# Patient Record
Sex: Male | Born: 1955
Health system: Southern US, Community
[De-identification: ages and names within clinical notes are randomized; demographics above are authoritative.]

## PROBLEM LIST (undated history)

## (undated) DIAGNOSIS — IMO0002 Reserved for concepts with insufficient information to code with codable children: Secondary | ICD-10-CM

## (undated) DIAGNOSIS — C801 Malignant (primary) neoplasm, unspecified: Secondary | ICD-10-CM

## (undated) DIAGNOSIS — M549 Dorsalgia, unspecified: Secondary | ICD-10-CM

## (undated) DIAGNOSIS — G8929 Other chronic pain: Secondary | ICD-10-CM

## (undated) DIAGNOSIS — N410 Acute prostatitis: Secondary | ICD-10-CM

## (undated) HISTORY — DX: Reserved for concepts with insufficient information to code with codable children: IMO0002

## (undated) HISTORY — PX: INCISE AND DRAIN ABCESS: PRO64

## (undated) HISTORY — DX: Malignant (primary) neoplasm, unspecified: C80.1

## (undated) HISTORY — DX: Acute prostatitis: N41.0

## (undated) HISTORY — PX: SPINE SURGERY: SHX786

## (undated) HISTORY — DX: Dorsalgia, unspecified: M54.9

## (undated) HISTORY — DX: Other chronic pain: G89.29

---

## 1994-12-22 HISTORY — PX: INGUINAL HERNIA REPAIR: SUR1180

## 1996-12-22 HISTORY — PX: APPENDECTOMY: SHX54

## 1998-09-04 ENCOUNTER — Ambulatory Visit (HOSPITAL_COMMUNITY): Admission: RE | Admit: 1998-09-04 | Discharge: 1998-09-04 | Payer: Self-pay | Admitting: Family Medicine

## 1998-09-04 ENCOUNTER — Encounter: Payer: Self-pay | Admitting: Family Medicine

## 1999-05-01 ENCOUNTER — Encounter: Payer: Self-pay | Admitting: Orthopedic Surgery

## 1999-05-01 ENCOUNTER — Inpatient Hospital Stay (HOSPITAL_COMMUNITY): Admission: AD | Admit: 1999-05-01 | Discharge: 1999-05-07 | Payer: Self-pay | Admitting: Orthopedic Surgery

## 2000-11-04 ENCOUNTER — Encounter: Payer: Self-pay | Admitting: Surgery

## 2000-11-04 ENCOUNTER — Ambulatory Visit (HOSPITAL_COMMUNITY): Admission: RE | Admit: 2000-11-04 | Discharge: 2000-11-04 | Payer: Self-pay | Admitting: Surgery

## 2000-11-06 ENCOUNTER — Encounter: Payer: Self-pay | Admitting: Surgery

## 2000-11-06 ENCOUNTER — Ambulatory Visit (HOSPITAL_COMMUNITY): Admission: RE | Admit: 2000-11-06 | Discharge: 2000-11-06 | Payer: Self-pay | Admitting: Surgery

## 2000-11-10 ENCOUNTER — Ambulatory Visit (HOSPITAL_COMMUNITY): Admission: RE | Admit: 2000-11-10 | Discharge: 2000-11-10 | Payer: Self-pay | Admitting: Gastroenterology

## 2000-12-22 HISTORY — PX: ESOPHAGOGASTRIC FUNDOPLICATION: SHX405

## 2000-12-28 ENCOUNTER — Encounter: Payer: Self-pay | Admitting: Surgery

## 2000-12-30 ENCOUNTER — Inpatient Hospital Stay (HOSPITAL_COMMUNITY): Admission: EM | Admit: 2000-12-30 | Discharge: 2000-12-31 | Payer: Self-pay | Admitting: Surgery

## 2002-07-20 ENCOUNTER — Inpatient Hospital Stay (HOSPITAL_COMMUNITY): Admission: EM | Admit: 2002-07-20 | Discharge: 2002-07-23 | Payer: Self-pay | Admitting: Psychiatry

## 2003-05-04 ENCOUNTER — Inpatient Hospital Stay (HOSPITAL_COMMUNITY): Admission: EM | Admit: 2003-05-04 | Discharge: 2003-05-08 | Payer: Self-pay | Admitting: Internal Medicine

## 2003-05-04 ENCOUNTER — Encounter: Payer: Self-pay | Admitting: Internal Medicine

## 2004-12-20 ENCOUNTER — Ambulatory Visit: Payer: Self-pay | Admitting: Internal Medicine

## 2005-01-01 ENCOUNTER — Ambulatory Visit (HOSPITAL_BASED_OUTPATIENT_CLINIC_OR_DEPARTMENT_OTHER): Admission: RE | Admit: 2005-01-01 | Discharge: 2005-01-01 | Payer: Self-pay | Admitting: Family Medicine

## 2005-01-01 ENCOUNTER — Ambulatory Visit: Payer: Self-pay | Admitting: Pulmonary Disease

## 2005-01-21 ENCOUNTER — Ambulatory Visit: Payer: Self-pay | Admitting: Internal Medicine

## 2006-01-19 ENCOUNTER — Ambulatory Visit: Payer: Self-pay | Admitting: Family Medicine

## 2006-05-20 ENCOUNTER — Ambulatory Visit: Payer: Self-pay | Admitting: Family Medicine

## 2006-06-10 ENCOUNTER — Ambulatory Visit: Payer: Self-pay | Admitting: Family Medicine

## 2007-07-22 ENCOUNTER — Ambulatory Visit: Payer: Self-pay | Admitting: Family Medicine

## 2007-07-22 ENCOUNTER — Encounter: Payer: Self-pay | Admitting: Family Medicine

## 2007-07-22 DIAGNOSIS — G4733 Obstructive sleep apnea (adult) (pediatric): Secondary | ICD-10-CM | POA: Insufficient documentation

## 2007-07-27 ENCOUNTER — Ambulatory Visit: Payer: Self-pay

## 2007-07-27 ENCOUNTER — Encounter: Payer: Self-pay | Admitting: Family Medicine

## 2007-08-24 ENCOUNTER — Encounter: Payer: Self-pay | Admitting: Internal Medicine

## 2007-09-29 ENCOUNTER — Telehealth: Payer: Self-pay | Admitting: Family Medicine

## 2007-11-11 ENCOUNTER — Encounter (INDEPENDENT_AMBULATORY_CARE_PROVIDER_SITE_OTHER): Payer: Self-pay | Admitting: Internal Medicine

## 2007-11-11 ENCOUNTER — Ambulatory Visit: Payer: Self-pay | Admitting: Family Medicine

## 2007-12-24 ENCOUNTER — Telehealth: Payer: Self-pay | Admitting: Family Medicine

## 2008-04-28 ENCOUNTER — Ambulatory Visit: Payer: Self-pay | Admitting: Family Medicine

## 2008-05-01 ENCOUNTER — Encounter (INDEPENDENT_AMBULATORY_CARE_PROVIDER_SITE_OTHER): Payer: Self-pay | Admitting: *Deleted

## 2008-07-24 ENCOUNTER — Telehealth (INDEPENDENT_AMBULATORY_CARE_PROVIDER_SITE_OTHER): Payer: Self-pay | Admitting: *Deleted

## 2008-09-20 ENCOUNTER — Emergency Department: Payer: Self-pay | Admitting: Emergency Medicine

## 2009-01-11 ENCOUNTER — Ambulatory Visit: Payer: Self-pay | Admitting: Family Medicine

## 2009-01-11 ENCOUNTER — Encounter (INDEPENDENT_AMBULATORY_CARE_PROVIDER_SITE_OTHER): Payer: Self-pay | Admitting: Internal Medicine

## 2009-01-16 LAB — CONVERTED CEMR LAB
ALT: 17 units/L (ref 0–53)
AST: 17 units/L (ref 0–37)
Albumin: 4.2 g/dL (ref 3.5–5.2)
Alkaline Phosphatase: 57 units/L (ref 39–117)
BUN: 12 mg/dL (ref 6–23)
Basophils Absolute: 0 10*3/uL (ref 0.0–0.1)
Basophils Relative: 0.5 % (ref 0.0–3.0)
Bilirubin, Direct: 0.1 mg/dL (ref 0.0–0.3)
CO2: 30 meq/L (ref 19–32)
Calcium: 9.3 mg/dL (ref 8.4–10.5)
Chloride: 104 meq/L (ref 96–112)
Cholesterol: 151 mg/dL (ref 0–200)
Creatinine, Ser: 0.8 mg/dL (ref 0.4–1.5)
Eosinophils Absolute: 0.1 10*3/uL (ref 0.0–0.7)
Eosinophils Relative: 1 % (ref 0.0–5.0)
GFR calc Af Amer: 132 mL/min
GFR calc non Af Amer: 109 mL/min
Glucose, Bld: 92 mg/dL (ref 70–99)
HCT: 45 % (ref 39.0–52.0)
HDL: 27.3 mg/dL — ABNORMAL LOW (ref >39.0)
Hemoglobin: 15.6 g/dL (ref 13.0–17.0)
LDL Cholesterol: 99 mg/dL (ref 0–99)
Lymphocytes Relative: 31.8 % (ref 12.0–46.0)
MCHC: 34.6 g/dL (ref 30.0–36.0)
MCV: 91 fL (ref 78.0–100.0)
Monocytes Absolute: 0.6 10*3/uL (ref 0.1–1.0)
Monocytes Relative: 10.9 % (ref 3.0–12.0)
Neutro Abs: 3.1 10*3/uL (ref 1.4–7.7)
Neutrophils Relative %: 55.8 % (ref 43.0–77.0)
PSA: 1.52 ng/mL (ref 0.10–4.00)
Platelets: 292 10*3/uL (ref 150–400)
Potassium: 3.5 meq/L (ref 3.5–5.1)
RBC: 4.95 M/uL (ref 4.22–5.81)
RDW: 12.5 % (ref 11.5–14.6)
Sodium: 141 meq/L (ref 135–145)
TSH: 0.98 microintl units/mL (ref 0.35–5.50)
Total Bilirubin: 0.9 mg/dL (ref 0.3–1.2)
Total CHOL/HDL Ratio: 5.5
Total Protein: 6.7 g/dL (ref 6.0–8.3)
Triglycerides: 124 mg/dL (ref 0–149)
VLDL: 25 mg/dL (ref 0–40)
WBC: 5.6 10*3/uL (ref 4.5–10.5)

## 2009-02-02 ENCOUNTER — Telehealth: Payer: Self-pay | Admitting: Family Medicine

## 2009-07-09 ENCOUNTER — Telehealth (INDEPENDENT_AMBULATORY_CARE_PROVIDER_SITE_OTHER): Payer: Self-pay | Admitting: Internal Medicine

## 2009-07-11 ENCOUNTER — Ambulatory Visit: Payer: Self-pay | Admitting: Family Medicine

## 2009-07-11 DIAGNOSIS — F329 Major depressive disorder, single episode, unspecified: Secondary | ICD-10-CM

## 2009-07-23 ENCOUNTER — Ambulatory Visit: Payer: Self-pay | Admitting: Family Medicine

## 2009-08-07 ENCOUNTER — Encounter: Payer: Self-pay | Admitting: Family Medicine

## 2009-09-14 ENCOUNTER — Telehealth: Payer: Self-pay | Admitting: Family Medicine

## 2010-02-19 DIAGNOSIS — N410 Acute prostatitis: Secondary | ICD-10-CM

## 2010-02-19 HISTORY — DX: Acute prostatitis: N41.0

## 2010-02-20 ENCOUNTER — Ambulatory Visit: Payer: Self-pay | Admitting: Family Medicine

## 2010-02-20 DIAGNOSIS — M549 Dorsalgia, unspecified: Secondary | ICD-10-CM | POA: Insufficient documentation

## 2010-02-20 LAB — CONVERTED CEMR LAB
Bacteria, UA: 0
Ketones, urine, test strip: NEGATIVE
Nitrite: NEGATIVE
Urine crystals, microscopic: 0 /hpf
Urobilinogen, UA: 0.2
WBC Urine, dipstick: NEGATIVE
Yeast, UA: 0

## 2010-03-11 ENCOUNTER — Telehealth: Payer: Self-pay | Admitting: Family Medicine

## 2010-03-25 ENCOUNTER — Ambulatory Visit: Payer: Self-pay | Admitting: Family Medicine

## 2010-03-25 LAB — CONVERTED CEMR LAB
Casts: 0 /lpf
Ketones, urine, test strip: NEGATIVE
RBC / HPF: 0
Urobilinogen, UA: 0.2
pH: 6

## 2010-03-26 ENCOUNTER — Encounter: Payer: Self-pay | Admitting: Family Medicine

## 2010-03-27 ENCOUNTER — Telehealth: Payer: Self-pay | Admitting: Family Medicine

## 2010-04-08 ENCOUNTER — Emergency Department: Payer: Self-pay | Admitting: Emergency Medicine

## 2010-05-16 ENCOUNTER — Ambulatory Visit: Payer: Self-pay | Admitting: Unknown Physician Specialty

## 2010-06-04 ENCOUNTER — Encounter: Payer: Self-pay | Admitting: Family Medicine

## 2010-06-04 ENCOUNTER — Ambulatory Visit: Payer: Self-pay | Admitting: Pain Medicine

## 2010-06-13 ENCOUNTER — Ambulatory Visit: Payer: Self-pay | Admitting: Pain Medicine

## 2010-07-01 ENCOUNTER — Ambulatory Visit: Payer: Self-pay | Admitting: Pain Medicine

## 2010-07-04 ENCOUNTER — Encounter: Admission: RE | Admit: 2010-07-04 | Discharge: 2010-07-04 | Payer: Self-pay | Admitting: Unknown Physician Specialty

## 2010-07-29 IMAGING — CR ORBITS FOR FOREIGN BODY - 2 VIEW
1 series · 3 of 3 positions shown · non-contrast
Comparison: none

REASON FOR EXAM: eval for metal in eye prior to mri
COMMENTS:

PROCEDURE:     DXR - DXR ORBITS FOR MRI CLEARANCE  - May 16, 2010 [DATE]
RESULT:     Three views of the orbits are submitted. I do not see evidence
of pertain metallic foreign bodies over the orbits. I see no
contraindication to MRI.

[Series 1: view not recorded · 0.17mm/px · 3 of 3 slices shown]
[im 1/3]
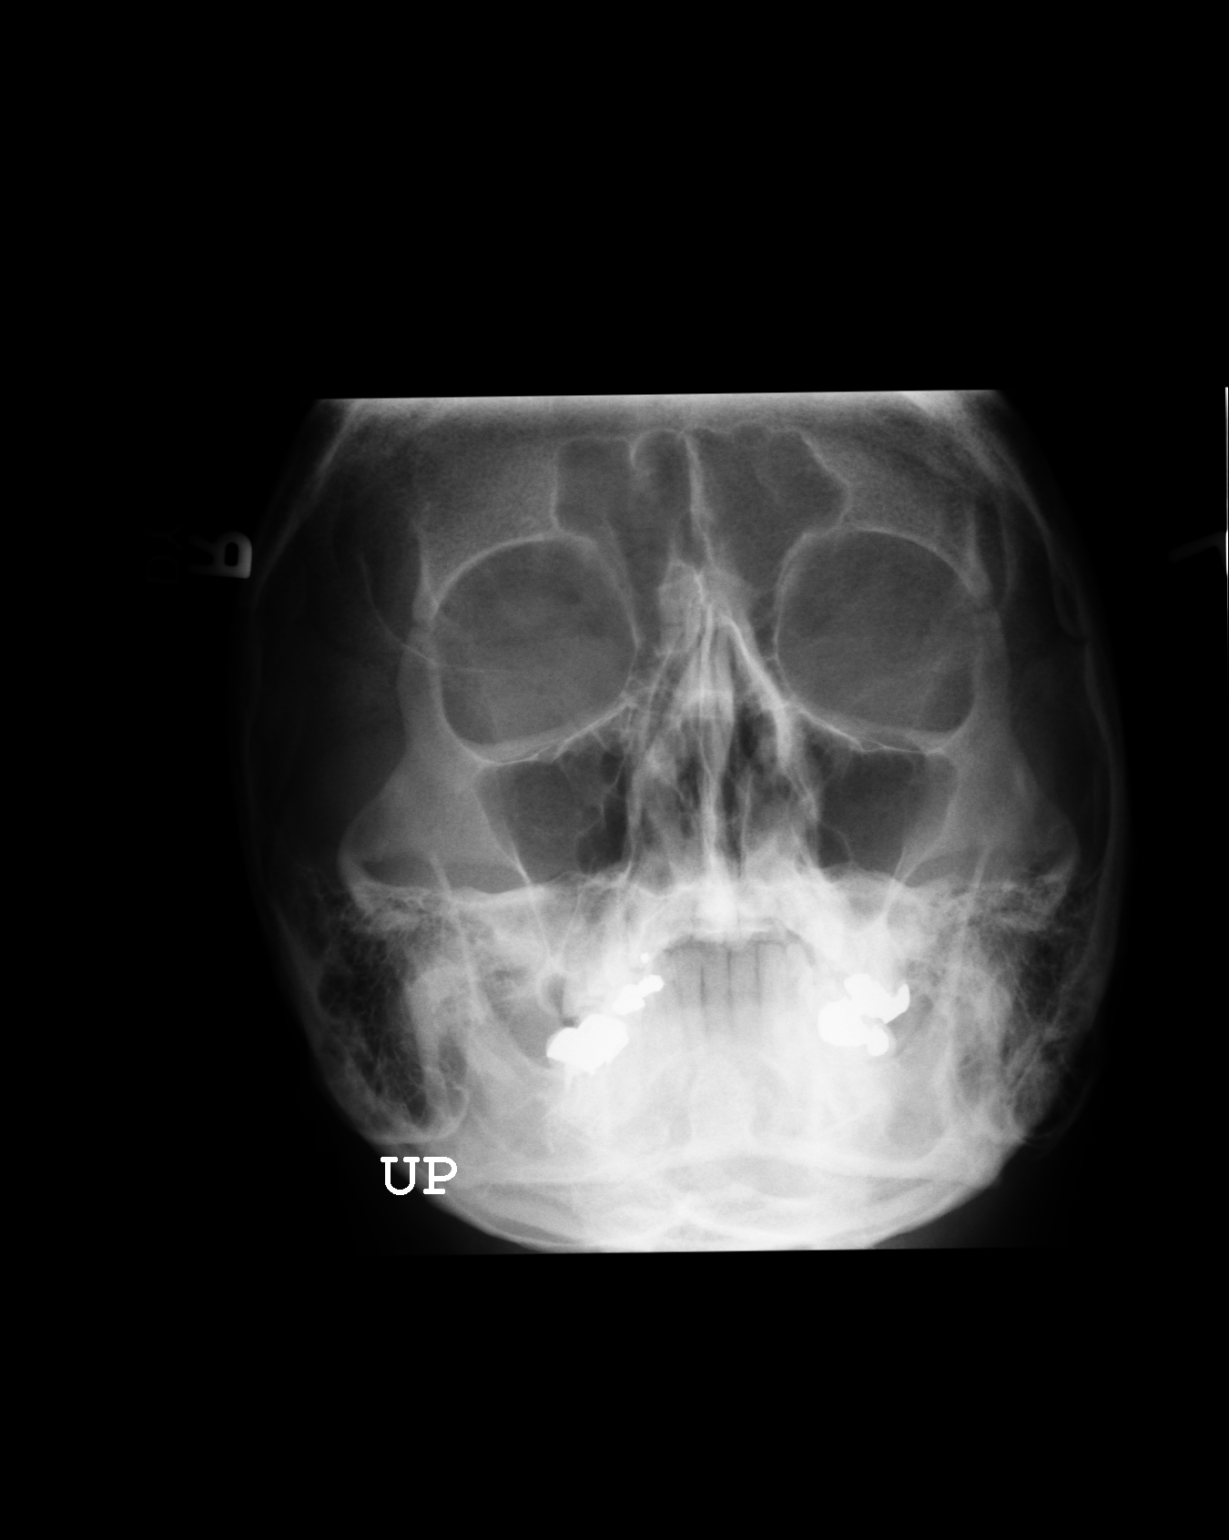
[im 2/3]
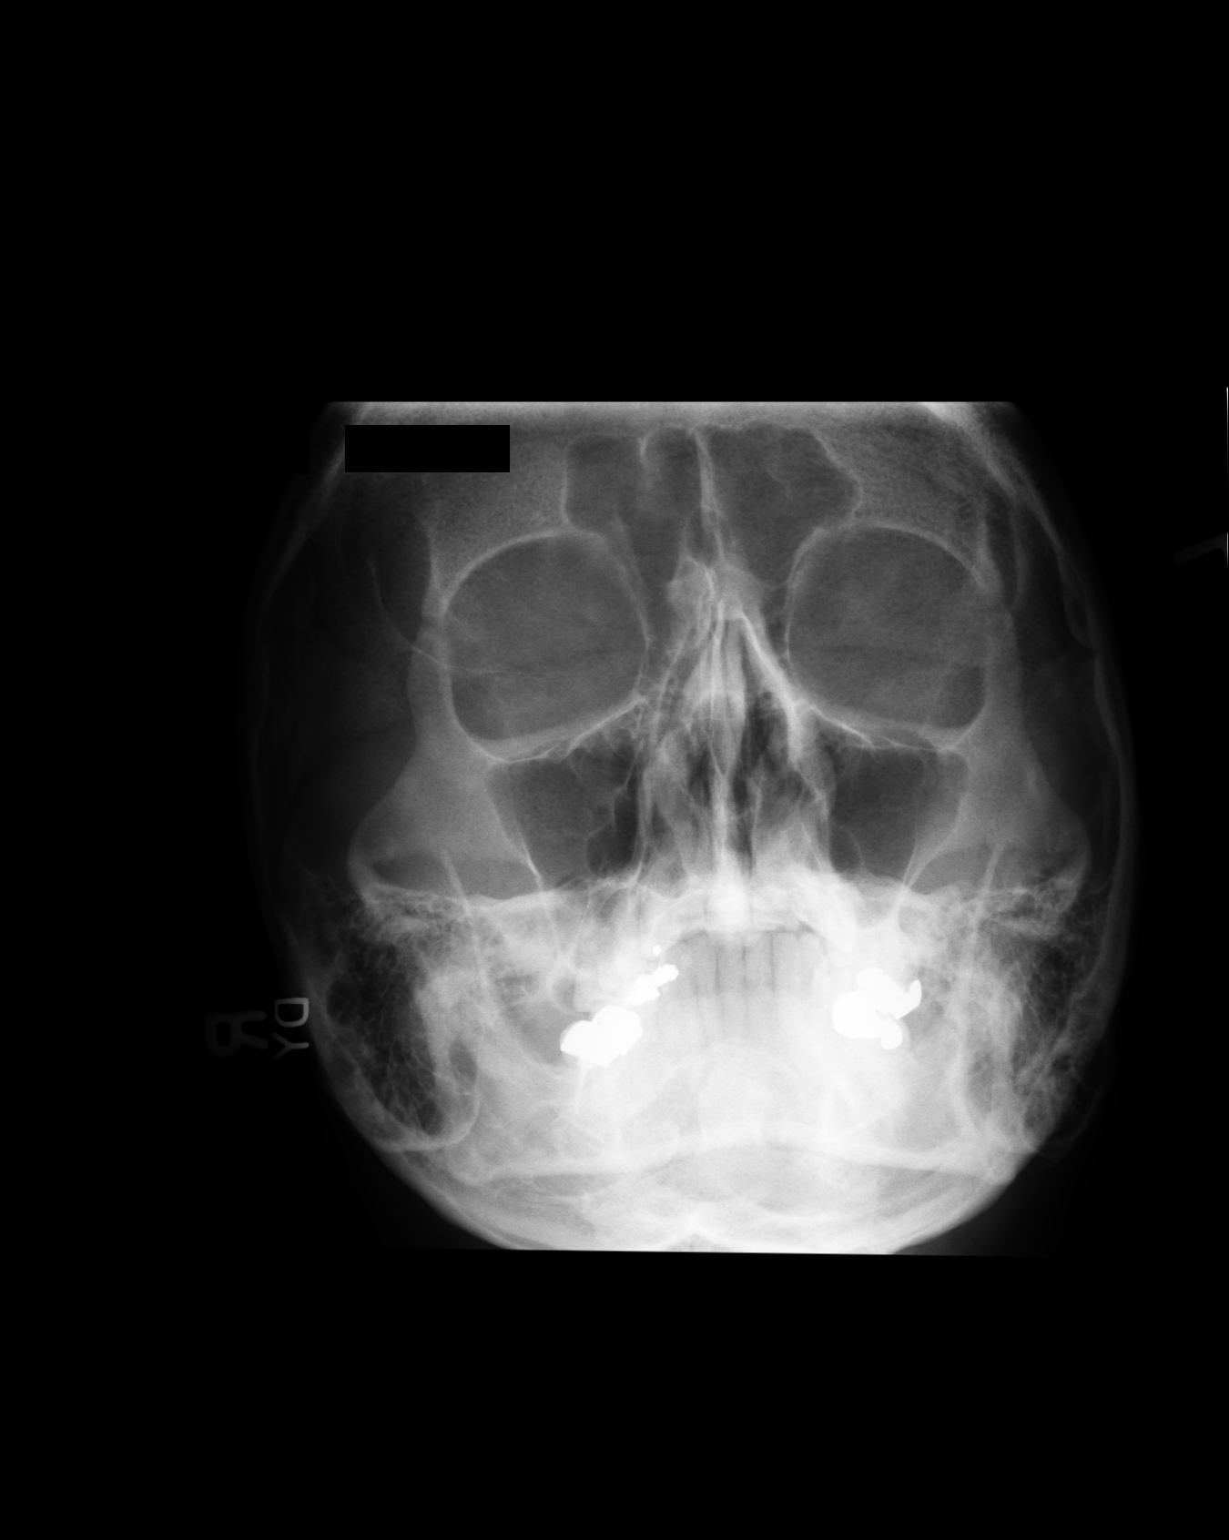
[im 3/3]
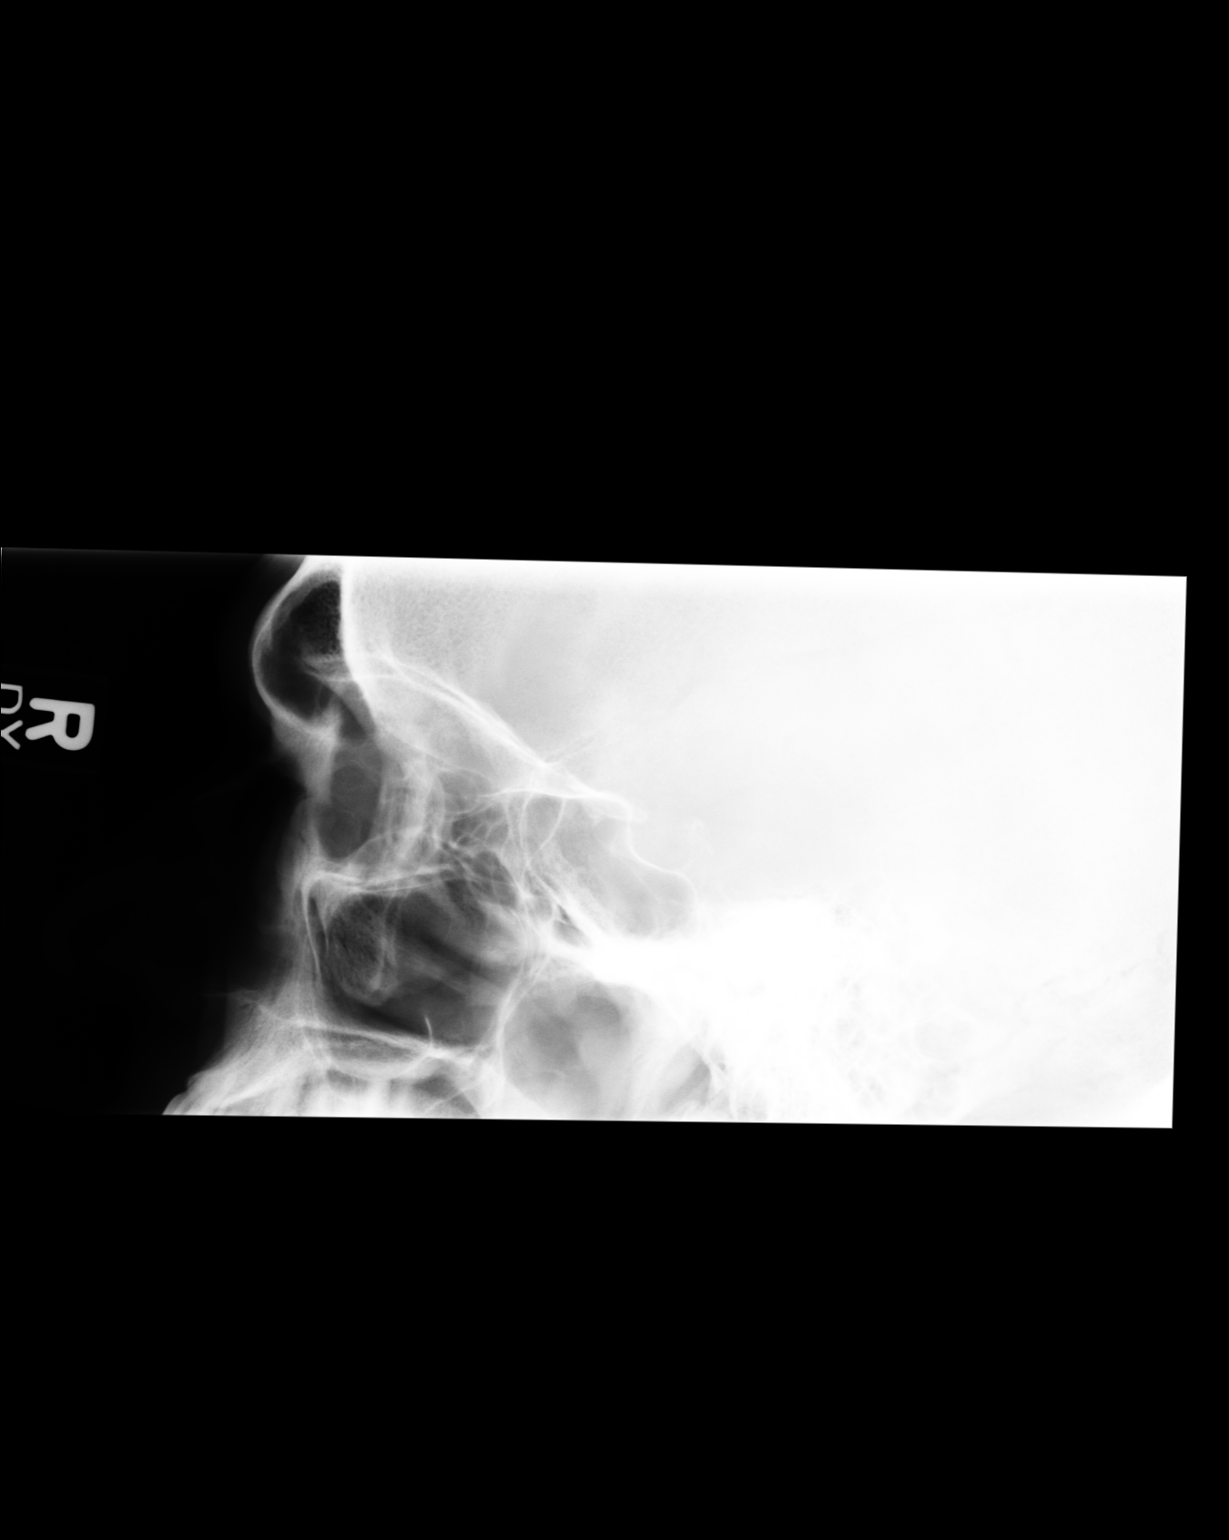

[3 of 3 positions shown; findings below may reference images not displayed]

IMPRESSION: I do not see evidence of retained metallic foreign bodies
over the orbits.

## 2010-07-31 ENCOUNTER — Encounter (INDEPENDENT_AMBULATORY_CARE_PROVIDER_SITE_OTHER): Payer: Self-pay | Admitting: *Deleted

## 2010-10-10 ENCOUNTER — Ambulatory Visit: Payer: Self-pay | Admitting: Pain Medicine

## 2010-10-28 ENCOUNTER — Ambulatory Visit: Payer: Self-pay | Admitting: Pain Medicine

## 2010-10-28 ENCOUNTER — Encounter: Payer: Self-pay | Admitting: Family Medicine

## 2010-12-11 ENCOUNTER — Telehealth: Payer: Self-pay | Admitting: Family Medicine

## 2010-12-11 ENCOUNTER — Ambulatory Visit: Payer: Self-pay | Admitting: Unknown Physician Specialty

## 2010-12-13 ENCOUNTER — Ambulatory Visit: Payer: Self-pay | Admitting: Unknown Physician Specialty

## 2011-01-12 ENCOUNTER — Encounter: Payer: Self-pay | Admitting: Unknown Physician Specialty

## 2011-01-23 NOTE — Letter (Signed)
Summary: [facility]  [facility]   Imported By: Lanelle Bal 06/10/2010 09:44:02  _____________________________________________________________________  External Attachment:    Type:   Image     Comment:   External Document

## 2011-01-23 NOTE — Assessment & Plan Note (Signed)
Summary: 1 M F/U DLO   Vital Signs:  Patient profile:   55 year old male Height:      69 inches Weight:      171.25 pounds BMI:     25.38 Temp:     97.9 degrees F oral Pulse rate:   92 / minute Pulse rhythm:   regular BP sitting:   124 / 80  (left arm) Cuff size:   regular  Vitals Entered By: Lewanda Rife LPN (March 25, 108 9:10 AM) CC: one month f/u on urine   History of Present Illness: here for f/u of prostatitis  was on course of full dose cipro prostate symptoms are better - absolutely no blood in urine or semen now  a little bit of suprapubic pressure --- also some diarrhea from the abx  thinks the pressure and abd rumbling is more rel to the diarrhea than the prostate   finished the cipro ---- 2 weeks ago  still having diarrhea -- ? some pain and crampy , no mucous in stool rectum is very irritated    also intermittent back pain- this is ongoing and chronc uses narcotic pain med occas  - no more than 2-3 times per week - and needs a refil had disc this with Dr Patsy Lager - about reasons not to overuse it     Allergies: 1)  ! Wellbutrin  Past History:  Past Surgical History: Last updated: 01/11/2009 Fundoplication--2002 inguinal hernia x2--mesh on second surg--1996 appendectomy--1998 I&D of L and R knees  Family History: Last updated: 03/25/2010 father - prostate cancer   Social History: Last updated: 01/11/2009 Marital Status: Married--third marriage Children: 4--2 are grown, 2 younger are withhim qoweekend and wife's daughter week Occupation: assembles hyldroloic power units fo rtrains  Risk Factors: Caffeine Use: 2 (01/11/2009) Exercise: no (01/11/2009)  Risk Factors: Smoking Status: never (01/11/2009) Passive Smoke Exposure: yes (01/11/2009)  Past Medical History: Chronic back pain (workman;s comp w/u and pain clinic in the past) acute prostatitis 3/11 family hx prostate cancer- father   Family History: father - prostate cancer   Review  of Systems General:  Denies chills, fatigue, fever, loss of appetite, and malaise. CV:  Denies chest pain or discomfort and palpitations. Resp:  Denies cough and wheezing. GI:  Complains of change in bowel habits and diarrhea; denies bloody stools, dark tarry stools, indigestion, loss of appetite, nausea, and vomiting. GU:  Complains of nocturia; denies dysuria, hematuria, and incontinence. MS:  Complains of low back pain and stiffness; denies cramps and muscle weakness. Derm:  Denies itching, lesion(s), poor wound healing, and rash. Neuro:  Denies numbness and tingling. Psych:  mood is ok . Endo:  Denies cold intolerance, excessive hunger, excessive thirst, excessive urination, and heat intolerance. Heme:  Denies abnormal bruising and bleeding.  Physical Exam  General:  Well-developed,well-nourished,in no acute distress; alert,appropriate and cooperative throughout examination Head:  normocephalic, atraumatic, and no abnormalities observed.   Eyes:  vision grossly intact, pupils equal, pupils round, and pupils reactive to light.  no conjunctival pallor, injection or icterus  Mouth:  pharynx pink and moist.   Neck:  supple with full rom and no masses or thyromegally, no JVD or carotid bruit  Lungs:  Normal respiratory effort, chest expands symmetrically. Lungs are clear to auscultation, no crackles or wheezes. Heart:  Normal rate and regular rhythm. S1 and S2 normal without gallop, murmur, click, rub or other extra sounds. Abdomen:  Bowel sounds positive,abdomen soft and non-tender without masses, organomegaly or  hernias noted. no suprapubic tenderness or fullness felt  Msk:  No deformity or scoliosis noted of thoracic or lumbar spine.  nl rom LS today Extremities:  No clubbing, cyanosis, edema, or deformity noted with normal full range of motion of all joints.   Neurologic:  sensation intact to light touch, gait normal, and DTRs symmetrical and normal.   Skin:  Intact without suspicious  lesions or rashes Cervical Nodes:  No lymphadenopathy noted Inguinal Nodes:  No significant adenopathy Psych:  normal affect, talkative and pleasant    Impression & Recommendations:  Problem # 1:  ACUTE PROSTATITIS (ICD-601.0) Assessment Improved resolved after course of cipro pend urine cx  will update if symptoms return plan for psa with his wellness labs in 3 mo and f/u ( disc fam hx of prostate ca)  Orders: T-Culture, Urine ([id number]) UA Dipstick W/ Micro (manual) (09811)  Problem # 2:  DIARRHEA (ICD-787.91) Assessment: New persistant since cipro need to check for c diff- order given for stool sample for this update if worse  stressed imp of good fluid intake to prev dehydration   Orders: T-Culture, C-Diff Toxin A/B ([id number]) Specimen Handling (99000)  Problem # 3:  BACK PAIN (ICD-724.5) Assessment: Unchanged chronic back pain presumably from deg disk and joint dz  workmans comp w/u and also pain clinic in past and PT  disc use of narcotic only when necessary- not for daily use / disc habit forming and sedating potential refil hydrocodone #30 no ref  today  His updated medication list for this problem includes:    Goodys Body Pain 500-325 Mg Pack (Aspirin-acetaminophen) ..... Otc as directed.    Hydrocodone-acetaminophen 5-500 Mg Tabs (Hydrocodone-acetaminophen) .Marland Kitchen... 1 by mouth q 6 hours as needed severe pain  Complete Medication List: 1)  Amlodipine Besylate 10 Mg Tabs (Amlodipine besylate) .... Take one by mouth daily 2)  Paxil 20 Mg Tabs (Paroxetine hcl) .... Take 1 tablet by mouth once a day 3)  Goodys Body Pain 500-325 Mg Pack (Aspirin-acetaminophen) .... Otc as directed. 4)  Hydrocodone-acetaminophen 5-500 Mg Tabs (Hydrocodone-acetaminophen) .Marland Kitchen.. 1 by mouth q 6 hours as needed severe pain  Patient Instructions: 1)  please do/ return stool test for c diff diarrrhea  2)  we will culture your urine and then update you  3)  keep drinking lots of water    4)  schedule fasting labs in 3 months wellness/ lipid v70.0 and psa for prostate screen  5)  then PE please - any 30 minute slot  6)  if your prostate symptoms  re - occur - please let me know  Prescriptions: HYDROCODONE-ACETAMINOPHEN 5-500 MG TABS (HYDROCODONE-ACETAMINOPHEN) 1 by mouth q 6 hours as needed severe pain  #30 x 0   Entered and Authorized by:   Judith Part MD   Signed by:   Judith Part MD on 03/25/2010   Method used:   Print then Give to Patient   RxID:   [id number]   Current Allergies (reviewed today): ! [facility]  Laboratory Results   Urine Tests    Routine Urinalysis   Color: yellow Appearance: Clear Glucose: negative   (Normal Range: Negative) Bilirubin: negative   (Normal Range: Negative) Ketone: negative   (Normal Range: Negative) Spec. Gravity: 1.010   (Normal Range: 1.003-1.035) Blood: trace-intact   (Normal Range: Negative) pH: 6.0   (Normal Range: 5.0-8.0) Protein: trace   (Normal Range: Negative) Urobilinogen: 0.2   (Normal Range: 0-1) Nitrite: negative   (Normal Range: Negative) Leukocyte Esterace:  negative   (Normal Range: Negative)  Urine Microscopic WBC/hpf: 0-1 RBC/hpf: 0 Bacteria: 0 Mucous: few- mod Epithelial: 1-4 Crystals/LPF: few Casts/LPF: 0 Yeast/HPF: 0 Other: 0

## 2011-01-23 NOTE — Progress Notes (Signed)
Summary: Wife says patient is very sick  Phone Note Call from Patient Call back at Home Phone [phone]   Caller: Spouse Call For: Judith Part MD Summary of Call: FYI:   I called to tell the patient that his urine culture was negative, as advised.  Wife answered saying that the patient was in bed, very sick with an intestinal infection.  She says he is very sick, he has been passing out, and she is concerned that something is very wrong with him.  I offered to make an appointment for him to come in.  She states she will have to get him to do that so that he can correlate it with his work because he has had to miss a lot of work and they are concerned that he will be fired. Initial call taken by: Delilah Shan CMA Duncan Dull),  March 27, 2010 2:35 PM  Follow-up for Phone Call        if he is so sick that he has been passing out - this is very worrisome for dehydration-- probably needs IV fluids  I want her to take him to ER for eval now (call ambulance if he is too weak) he had c/o diarrhea when he was here - but mild at the time- sounds like this has become much worse  his c diff and urine cx are neg - which is re- assuring - but he needs addn eval and likely IV fluids  thank you for updating me about this change  Follow-up by: Judith Part MD,  March 27, 2010 3:12 PM  Additional Follow-up for Phone Call Additional follow up Details #1::        Advised pt's wife, she agreed to get pt to the ER. Lowella Petties CMA  March 27, 2010 3:20 PM  if you know what hospital he is going to -- please fax last 2 office notes and also c diff and urine cx results to that ER- thanks  Additional Follow-up by: Judith Part MD,  March 27, 2010 3:35 PM    Additional Follow-up for Phone Call Additional follow up Details #2::    I called pt's house, no answer.             Lowella Petties CMA  March 27, 2010 3:43 PM  I spoke with pt he had just woke up and plans to go to [facility] [phone]. I called spoke with Mare Loan and she gave fax # [phone]. Spoke with nurse in ER at Cecilia's request to let know pt was coming and I was faxing last 2 office visits and lab. Faxed completed.Lewanda Rife LPN  March 27, 6294 4:27 PM

## 2011-01-23 NOTE — Assessment & Plan Note (Signed)
Summary: blood in urine/ alc   Vital Signs:  Patient profile:   55 year old male Height:      69 inches Weight:      174.4 pounds BMI:     25.85 Temp:     97.7 degrees F oral Pulse rate:   80 / minute Pulse rhythm:   regular BP sitting:   130 / 80  (left arm) Cuff size:   regular  Vitals Entered By: Benny Lennert CMA Duncan Dull) (February 20, 2010 10:28 AM)   History of Present Illness: Chief complaint blood in urine  Some discomfort with urinating. No penile discharge, no pain with ejaculation. Gross blood on urination, but this has only come after ejaculation. Has happened x 2 for the patient.  No STD exposure. no history of prostatitis   also c/o back pain, went to worker's comp MD, had eval, ultimately went to pain clinic, got better some with PT for about a year. NSAIDS don't work except Goody's and muscle relaxants have made him quite drowsy.  Allergies: 1)  ! Wellbutrin  Past History:  Past medical, surgical, family and social histories (including risk factors) reviewed, and no changes noted (except as noted below).  Past Medical History: Chronic back pain  Past Surgical History: Reviewed history from 01/11/2009 and no changes required. Fundoplication--2002 inguinal hernia x2--mesh on second surg--1996 appendectomy--1998 I&D of L and R knees  Family History: Reviewed history and no changes required.  Social History: Reviewed history from 01/11/2009 and no changes required. Marital Status: Married--third marriage Children: 4--2 are grown, 2 younger are withhim qoweekend and wife's daughter week Occupation: assembles hyldroloic power units fo rtrains  Review of Systems      See HPI General:  Denies chills, fatigue, and fever. GU:  Complains of dysuria and hematuria; denies discharge, genital sores, incontinence, nocturia, urinary frequency, and urinary hesitancy.  Physical Exam  General:  Well-developed,well-nourished,in no acute distress;  alert,appropriate and cooperative throughout examination Head:  Normocephalic and atraumatic without obvious abnormalities. No apparent alopecia or balding. Ears:  no external deformities.   Nose:  no external deformity.   Lungs:  normal respiratory effort.   Rectal:  No external abnormalities noted. Normal sphincter tone. No rectal masses or tenderness. Genitalia:  Testes bilaterally descended without nodularity, tenderness or masses. No scrotal masses or lesions. No penis lesions or urethral discharge. Prostate:  mild enlargement, but there is diffuse tenderness on palpation, not hot. Extremities:  No clubbing, cyanosis, edema, or deformity noted with normal full range of motion of all joints.   Psych:  Cognition and judgment appear intact. Alert and cooperative with normal attention span and concentration. No apparent delusions, illusions, hallucinations   Impression & Recommendations:  Problem # 1:  ACUTE PROSTATITIS (ICD-601.0) Assessment New For acute prostatitis, ABX  With gross and microscopic hematuria, would recheck urine micro in 1 month to document resolution of hematuria and if remains hematuria, work-up indicated.  Problem # 2:  GROSS HEMATURIA (ICD-599.71) Assessment: New  His updated medication list for this problem includes:    Ciprofloxacin Hcl 500 Mg Tabs (Ciprofloxacin hcl) .Marland Kitchen... 1 by mouth two times a day for 14 days  Orders: UA Dipstick W/ Micro (manual) (16109)  Problem # 3:  BACK PAIN (ICD-724.5) chronic back pain discussed with patient and reviewed records - i think it is reasonable for him to have a small number of vicodin to have on hand for bad days, but discouraged chronic use.   I am going to  cc Dr. Milinda Antis, and I think she needs to be MD following. I would not place him on chronic narcotics, but as needed use is reasonable.  His updated medication list for this problem includes:    Goodys Body Pain 500-325 Mg Pack (Aspirin-acetaminophen) ..... Otc as  directed.    Hydrocodone-acetaminophen 5-500 Mg Tabs (Hydrocodone-acetaminophen) .Marland Kitchen... 1 by mouth q 6 hours as needed severe pain  Complete Medication List: 1)  Amlodipine Besylate 10 Mg Tabs (Amlodipine besylate) .... Take one by mouth daily 2)  Paxil 20 Mg Tabs (Paroxetine hcl) .... Take 1 tablet by mouth once a day 3)  Goodys Body Pain 500-325 Mg Pack (Aspirin-acetaminophen) .... Otc as directed. 4)  Ciprofloxacin Hcl 500 Mg Tabs (Ciprofloxacin hcl) .Marland Kitchen.. 1 by mouth two times a day for 14 days 5)  Hydrocodone-acetaminophen 5-500 Mg Tabs (Hydrocodone-acetaminophen) .Marland Kitchen.. 1 by mouth q 6 hours as needed severe pain Prescriptions: HYDROCODONE-ACETAMINOPHEN 5-500 MG TABS (HYDROCODONE-ACETAMINOPHEN) 1 by mouth q 6 hours as needed severe pain  #30 x 0   Entered and Authorized by:   Hannah Beat MD   Signed by:   Hannah Beat MD on 02/20/2010   Method used:   [facility] ...       [facility] [address] [phone][facility] (retail)       [address] [place], [place]  [address]       Ph: [phone] or [phone]       Fax: [phone]   RxID:   [id number] CIPROFLOXACIN HCL 500 MG TABS (CIPROFLOXACIN HCL) 1 by mouth two times a day for 14 days  #28 x 0   Entered and Authorized by:   Hannah Beat MD   Signed by:   Hannah Beat MD on 02/20/2010   Method used:   [facility]ronically to        [facility] [phone]* (retail)       [address] [place], [place]  [address]       Ph: [phone] or [phone]       Fax: [phone]   RxID:   [id number]   Current Allergies (reviewed today): ! [facility]  Laboratory Results   Urine Tests  Date/Time Received: February 20, 2010 10:33 AM  Date/Time Reported: February 20, 2010 10:33 AM   Routine Urinalysis   Color: yellow Appearance: Clear Glucose: negative   (Normal Range: Negative) Bilirubin: negative   (Normal Range: Negative) Ketone: negative   (Normal Range: Negative) Spec. Gravity: 1.015   (Normal Range: 1.003-1.035) Blood: large    (Normal Range: Negative) pH: 6.0   (Normal Range: 5.0-8.0) Protein: negative   (Normal Range: Negative) Urobilinogen: 0.2   (Normal Range: 0-1) Nitrite: negative   (Normal Range: Negative) Leukocyte Esterace: negative   (Normal Range: Negative)  Urine Microscopic WBC/HPF: 0-1 RBC/HPF: Many Bacteria/HPF: 0 Epithelial/HPF: 1-2 Crystals/HPF: 0 Yeast/HPF: 0

## 2011-01-23 NOTE — Letter (Signed)
Summary: Colonoscopy Letter  [facility]  [address] [place], [place] [address]   Phone: [phone]  Fax: [phone]      July 31, 2010 MRN: [id number]   Jerome Hernandez [address] [place], [place]  [address]   Dear Mr. KAUFMAN,   According to your medical record, it is time for you to schedule a Colonoscopy. The American Cancer Society recommends this procedure as a method to detect early colon cancer. Patients with a family history of colon cancer, or a personal history of colon polyps or inflammatory bowel disease are at increased risk.  This letter has beeen generated based on the recommendations made at the time of your procedure. If you feel that in your particular situation this may no longer apply, please contact our office.  Please call our office at [phone] to schedule this appointment or to update your records at your earliest convenience.  Thank you for cooperating with [place] to provide you with the very best care possible.   Sincerely,   Vania Rea. Jarold Motto, M.D.  [facility] [phone]

## 2011-01-23 NOTE — Progress Notes (Signed)
Summary: potassium is low  Phone Note From Other Clinic   Caller: [facility] pre admit testing  [phone] Summary of Call: Pt is scheduled for back surgery on 12/23 and his potassium today is 3.2.  This needs to be at least 3.4.  Pt will need a quick supplement but I have been trying to get in touch with him all afternoon.  He is not at work and his cell phone is not taking calls.  I notified Clydie Braun in pre admitting about this,  she said she would try to get in touch with his surgeon.                Lowella Petties CMA, AAMA  December 11, 2010 3:25 PM   Follow-up for Phone Call        I'm not aware of any meds that would lower his K ... but have not seen him in a while so probably do not have an up to date med list   if you can reach him -- I will put px on emr for call in  they will have to re check his K the day of surgery  px written on EMR for call in  Follow-up by: Judith Part MD,  December 11, 2010 4:34 PM  Additional Follow-up for Phone Call Additional follow up Details #1::        Unable to reach pt. Pt is not at work, cell # not accepting calls. I spoke with Dondra Spry at pre admission at [facility] and they have not been able to reach pt either. I called Guerry Minors and asked if h e had a contact # and he does not. Lewanda Rife LPN  December 11, 2010 4:51 PM   Still not able to reach pt.Lewanda Rife LPN  December 11, 2010 5:09 PM     Additional Follow-up for Phone Call Additional follow up Details #2::    Patient notified as instructed by telephone.  [facility] preadmitting notified as instructed as well spoke with Sherrie. Med sent [facility] to [facility] as instructed.Lewanda Rife LPN  December 12, 2010 11:28 AM   New/Updated Medications: KLOR-CON M20 20 MEQ CR-TABS (POTASSIUM CHLORIDE CRYS CR) 1 by mouth two times a day today and tomorrow Prescriptions: KLOR-CON M20 20 MEQ CR-TABS (POTASSIUM CHLORIDE CRYS CR) 1 by mouth two times a day today and tomorrow  #4 x 0   Entered by:   Lewanda Rife  LPN   Authorized by:   Judith Part MD   Signed by:   Lewanda Rife LPN on 29/56/2130   Method used:   Electronically to        [facility] [address] (retail)       [address] [place], [place]  [address]       Ph: [phone] or [phone]       Fax: [phone]   RxID:   [id number] KLOR-CON M20 20 MEQ CR-TABS (POTASSIUM CHLORIDE CRYS CR) 1 by mouth two times a day today and tomorrow  #4 x 0   Entered and Authorized by:   Judith Part MD   Signed by:   Lewanda Rife LPN on 95/18/8416   Method used:   Telephoned to ...       [facility] [address] [phone]* (retail)       [address] [place], [place]  [address]  Ph: [phone] or [phone]       Fax: [phone]   RxID:   [id number]

## 2011-01-23 NOTE — Letter (Signed)
Summary: [facility]  [facility]   Imported By: Lanelle Bal 11/06/2010 14:25:13  _____________________________________________________________________  External Attachment:    Type:   Image     Comment:   External Document

## 2011-01-23 NOTE — Progress Notes (Signed)
Summary: still having blood in urine  Phone Note Call from Patient Call back at Home Phone [phone]   Caller: Patient Call For: Judith Part MD Summary of Call: Patient says that he is still having blood in urine. He says that before it was mostly after ejaculation, but now happens at random times. He described it as looking like red koolaid. He also says that at times it looks milky. Has had fever,chills,nausea. He says that he has 2 to 3 days of antibiotic left. I asked him if he took it as instructed and he informed me that in the beginning he was taking 1 every 12 hours, but then started taking one every 24 hours. Patient wants to know what he needs to do. He says that he lives in [place] and it is hard for him to come in for appt. [facility]  Initial call taken by: Melody Comas,  March 11, 2010 2:23 PM  Follow-up for Phone Call        ABX not taken properly Should be taking two times a day   Extend to 1 month course Follow-up by: Hannah Beat MD,  March 11, 2010 2:47 PM  Additional Follow-up for Phone Call Additional follow up Details #1::        Patient advised.Consuello Masse CMA  Additional Follow-up by: Benny Lennert CMA Duncan Dull),  March 11, 2010 2:52 PM    Prescriptions: CIPROFLOXACIN HCL 500 MG TABS (CIPROFLOXACIN HCL) 1 by mouth two times a day for 14 days  #60 x 0   Entered and Authorized by:   Hannah Beat MD   Signed by:   Hannah Beat MD on 03/11/2010   Method used:   Telephoned to ...       [facility] [address]* (retail)       [address] [place], [place]  [address]       Ph: [phone] or [phone]       Fax: [phone]   RxID:   [id number]

## 2011-03-03 ENCOUNTER — Ambulatory Visit (INDEPENDENT_AMBULATORY_CARE_PROVIDER_SITE_OTHER): Payer: 59 | Admitting: Family Medicine

## 2011-03-03 ENCOUNTER — Other Ambulatory Visit: Payer: Self-pay | Admitting: Family Medicine

## 2011-03-03 ENCOUNTER — Encounter: Payer: Self-pay | Admitting: Family Medicine

## 2011-03-03 DIAGNOSIS — Z125 Encounter for screening for malignant neoplasm of prostate: Secondary | ICD-10-CM

## 2011-03-03 DIAGNOSIS — I10 Essential (primary) hypertension: Secondary | ICD-10-CM

## 2011-03-03 DIAGNOSIS — N401 Enlarged prostate with lower urinary tract symptoms: Secondary | ICD-10-CM

## 2011-03-03 DIAGNOSIS — F329 Major depressive disorder, single episode, unspecified: Secondary | ICD-10-CM

## 2011-03-04 LAB — HEPATIC FUNCTION PANEL
Bilirubin, Direct: 0.1 mg/dL (ref 0.0–0.3)
Total Bilirubin: 0.6 mg/dL (ref 0.3–1.2)

## 2011-03-04 LAB — CBC WITH DIFFERENTIAL/PLATELET
Basophils Absolute: 0 10*3/uL (ref 0.0–0.1)
Eosinophils Absolute: 0.2 10*3/uL (ref 0.0–0.7)
MCHC: 34.3 g/dL (ref 30.0–36.0)
MCV: 90.9 fl (ref 78.0–100.0)
Monocytes Absolute: 0.8 10*3/uL (ref 0.1–1.0)
Neutrophils Relative %: 50.6 % (ref 43.0–77.0)
Platelets: 300 10*3/uL (ref 150.0–400.0)
WBC: 7.9 10*3/uL (ref 4.5–10.5)

## 2011-03-04 LAB — LIPID PANEL
Cholesterol: 148 mg/dL (ref 0–200)
LDL Cholesterol: 84 mg/dL (ref 0–99)
Triglycerides: 142 mg/dL (ref 0.0–149.0)

## 2011-03-04 LAB — RENAL FUNCTION PANEL
Albumin: 4.5 g/dL (ref 3.5–5.2)
BUN: 16 mg/dL (ref 6–23)
CO2: 30 mEq/L (ref 19–32)
Calcium: 9.2 mg/dL (ref 8.4–10.5)
Chloride: 98 mEq/L (ref 96–112)

## 2011-03-04 LAB — PSA: PSA: 0.77 ng/mL (ref 0.10–4.00)

## 2011-03-11 NOTE — Assessment & Plan Note (Signed)
Summary: med refills   Vital Signs:  Patient profile:   55 year old male Height:      69 inches Weight:      168.50 pounds BMI:     24.97 Temp:     98.6 degrees F oral Pulse rate:   84 / minute Pulse rhythm:   regular BP sitting:   128 / 88  (left arm) Cuff size:   regular  Vitals Entered By: Lewanda Rife LPN (March 03, 2011 4:12 PM) CC: med refills   History of Present Illness: has been doing ok - overall   had back surgery -- trimmed off some disk and took off spurs   is doing well with paxil overall  has tried to stop it cold and wean off of it  when he stops it -gets depression quickly , with some ringing of ears and dizziness plan is to stay on it   wife has been out of work for 2 years  he is back to work   on amlodipine 10 mg  eating healthy diet  128/88 is doing PT - last week of that after his surgery - rom is definitely better    some difficulty with urination  no pain - just flow is slow -- dribbles afterwards  father had prostate cancer no pain or signs of infection     Allergies: 1)  ! Wellbutrin  Past History:  Family History: Last updated: 03/25/2010 father - prostate cancer   Social History: Last updated: 01/11/2009 Marital Status: Married--third marriage Children: 4--2 are grown, 2 younger are withhim qoweekend and wife's daughter week Occupation: assembles hyldroloic power units fo rtrains  Risk Factors: Caffeine Use: 2 (01/11/2009) Exercise: no (01/11/2009)  Risk Factors: Smoking Status: never (01/11/2009) Passive Smoke Exposure: yes (01/11/2009)  Past Medical History: Chronic back pain (workman;s comp w/u and pain clinic in the past) acute prostatitis 3/11 family hx prostate cancer- father  deg disk dz with -- surgery   Past Surgical History: Fundoplication--2002 inguinal hernia x2--mesh on second surg--1996 appendectomy--1998 I&D of L and R knees L4-L5 partial discectomy with spur removal   Review of Systems General:   Denies fatigue, loss of appetite, and malaise. Eyes:  Denies blurring and eye irritation. CV:  Denies chest pain or discomfort, lightheadness, palpitations, and shortness of breath with exertion. Resp:  Denies cough, shortness of breath, and wheezing. GI:  Denies abdominal pain, change in bowel habits, indigestion, and nausea. GU:  Complains of nocturia, urinary frequency, and urinary hesitancy; denies dysuria, hematuria, and incontinence. MS:  Denies joint pain. Derm:  Denies itching, lesion(s), poor wound healing, and rash. Neuro:  Denies headaches, tingling, and tremors. Psych:  Denies irritability, panic attacks, and sense of great danger. Endo:  Denies cold intolerance, excessive thirst, excessive urination, and heat intolerance. Heme:  Denies abnormal bruising and bleeding.  Physical Exam  General:  Well-developed,well-nourished,in no acute distress; alert,appropriate and cooperative throughout examination Head:  normocephalic, atraumatic, and no abnormalities observed.   Eyes:  vision grossly intact, pupils equal, pupils round, and pupils reactive to light.   Mouth:  pharynx pink and moist.   Neck:  supple with full rom and no masses or thyromegally, no JVD or carotid bruit  Chest Wall:  No deformities, masses, tenderness or gynecomastia noted. Lungs:  Normal respiratory effort, chest expands symmetrically. Lungs are clear to auscultation, no crackles or wheezes. Heart:  Normal rate and regular rhythm. S1 and S2 normal without gallop, murmur, click, rub or other extra sounds.  Abdomen:  Bowel sounds positive,abdomen soft and non-tender without masses, organomegaly or hernias noted. no renal bruits  Rectal:  No external abnormalities noted. Normal sphincter tone. No rectal masses or tenderness. Prostate:  mild enlargment without tenderness firm and symmetric without palpable nodules  Msk:  No deformity or scoliosis noted of thoracic or lumbar spine.  nl rom LS today Pulses:  R and L  carotid,radial,femoral,dorsalis pedis and posterior tibial pulses are full and equal bilaterally Extremities:  No clubbing, cyanosis, edema, or deformity noted with normal full range of motion of all joints.   Neurologic:  sensation intact to light touch, gait normal, and DTRs symmetrical and normal.   Skin:  Intact without suspicious lesions or rashes Cervical Nodes:  No lymphadenopathy noted Inguinal Nodes:  No significant adenopathy Psych:  normal affect, talkative and pleasant    Impression & Recommendations:  Problem # 1:  HYPERTENSION, BENIGN (ICD-401.1) Assessment Unchanged good control with amlodipine disc lifestyle habits  labs today refil med His updated medication list for this problem includes:    Amlodipine Besylate 10 Mg Tabs (Amlodipine besylate) .Marland Kitchen... Take one by mouth daily  Orders: Venipuncture (11914) TLB-Lipid Panel (80061-LIPID) TLB-Renal Function Panel (80069-RENAL) TLB-CBC Platelet - w/Differential (85025-CBCD) TLB-Hepatic/Liver Function Pnl (80076-HEPATIC) TLB-TSH (Thyroid Stimulating Hormone) (78295-AOZ) Prescription Created Electronically [phone])  BP today: 128/88 Prior BP: 124/80 (03/25/2010)  Labs Reviewed: K+: 3.5 (01/11/2009) Creat: : 0.8 (01/11/2009)   Chol: 151 (01/11/2009)   HDL: 27.3 (01/11/2009)   LDL: 99 (01/11/2009)   TG: 124 (01/11/2009)  Problem # 2:  DEPRESSION (ICD-311) Assessment: Unchanged  this is stable on paxil  no new stressors except back surgery doing better will continue current dose  His updated medication list for this problem includes:    Paxil 20 Mg Tabs (Paroxetine hcl) .Marland Kitchen... Take 1 tablet by mouth once a day  Orders: Prescription Created Electronically [phone])  Problem # 3:  BENIGN PROSTATIC HYPERTROPHY, WITH URINARY OBSTRUCTION (ICD-600.01) Assessment: New mild symptoms  pt not ready for medication at this time in light of hx of prostate ca in family - will check psa  Problem # 4:  SPECIAL SCREENING  MALIGNANT NEOPLASM OF PROSTATE (ICD-V76.44) Assessment: Unchanged psa today  see above Orders: Venipuncture (62952) TLB-PSA (Prostate Specific Antigen) (84153-PSA)  Complete Medication List: 1)  Amlodipine Besylate 10 Mg Tabs (Amlodipine besylate) .... Take one by mouth daily 2)  Paxil 20 Mg Tabs (Paroxetine hcl) .... Take 1 tablet by mouth once a day 3)  Goodys Body Pain 500-325 Mg Pack (Aspirin-acetaminophen) .... Otc as directed. 4)  Nucynta 75 Mg Tabs (Tapentadol hcl) .... One tablet by mouth three times a day as needed.  Patient Instructions: 1)  if you want to try something for slow urination/ enlarged prostate-- let me know  2)  labs today including psa  3)  no changes in medicines  Prescriptions: PAXIL 20 MG  TABS (PAROXETINE HCL) Take 1 tablet by mouth once a day  #30 x 11   Entered and Authorized by:   Judith Part MD   Signed by:   Judith Part MD on 03/03/2011   Method used:   Electronically to        [facility] [address] (retail)       [address] [place], [place]  [address]       Ph: [phone] or [phone]       Fax: [phone]   RxID:   [id number]  AMLODIPINE BESYLATE 10 MG  TABS (AMLODIPINE BESYLATE) take one by mouth daily  #30 x 11   Entered and Authorized by:   Judith Part MD   Signed by:   Judith Part MD on 03/03/2011   Method used:   [facility]ronically to        [facility] [phone]* (retail)       [address] [place], [place]  [address]       Ph: [phone] or [phone]       Fax: [phone]   RxID:   [id number]    Orders Added: 1)  Venipuncture [36644] 2)  TLB-Lipid Panel [80061-LIPID] 3)  TLB-Renal Function Panel [80069-RENAL] 4)  TLB-CBC Platelet - w/Differential [85025-CBCD] 5)  TLB-Hepatic/Liver Function Pnl [80076-HEPATIC] 6)  TLB-TSH (Thyroid Stimulating Hormone) [84443-TSH] 7)  TLB-PSA (Prostate Specific Antigen) [03474-QVZ] 8)  Prescription Created Electronically [G8553] 9)  Est. Patient Level IV  [56387]    Current Allergies (reviewed today): ! [facility]

## 2011-05-09 NOTE — H&P (Signed)
NAME:  Jerome Hernandez, Jerome Hernandez                       ACCOUNT NO.:  [id number]   MEDICAL RECORD NO.:  [id number]                   PATIENT TYPE:  INP   LOCATION:  0448                                 FACILITY:  [facility]   PHYSICIAN:  Rene Paci, M.D. [facility]          DATE OF BIRTH:  May 20, 1956   DATE OF ADMISSION:  05/04/2003  DATE OF DISCHARGE:  05/08/2003                                HISTORY & PHYSICAL   CHIEF COMPLAINT:  Swelling, pain right knee.   HISTORY OF PRESENT ILLNESS:  The patient is a 55 year old white gentleman  with a past medical history of left knee cellulitis requiring surgery (per  his report) who presents to his primary care physician's office, Dr. Idamae Schuller  A. Tower, as an outpatient complaining of right knee redness, pain, and  swelling.  He reports having popped a pimple two days prior to today.  He  believes this is what resulted in the subsequent swelling.  He has had no  fevers, chills.  He has had no nausea, vomiting.  He has had no limitation  in his range of motion or difficulty weightbearing.  He has had no other  joints affected.   PAST MEDICAL HISTORY:  Significant for:  1. Multiple psychiatric visits related to depression.  2. He is status post Nissen fundoplication.  3. He has hypertension which is well controlled.   MEDICATIONS:  1. Norvasc 5 mg p.o. daily.  2. Paxil 20 mg p.o. daily.  3. Pepcid 20 mg p.o. daily.   ALLERGIES:  No known allergies.   FAMILY HISTORY:  Noncontributory.   SOCIAL HISTORY:  He lives alone and denies use of alcohol or tobacco.   REVIEW OF SYSTEMS:  Negative except as stated above.   PHYSICAL EXAMINATION:  VITAL SIGNS:  At the time of admission he is mildly  febrile at 100.6, blood pressure 148/85, pulse of 94, respiratory rate 18,  O2 saturation 95% on room air.  GENERAL:  He is a somewhat withdrawn but pleasant middle-aged white man in  no acute distress.  LUNGS:  Clear to auscultation.  CARDIOVASCULAR:  Regular  rate and rhythm.  ABDOMEN:  Soft and nontender.  With good bowel sounds throughout.  EXTREMITIES:  Right extremity shows marked erythema with moderate effusion.  No limitation in range of motion.  No calf or thigh swelling.   LABORATORY DATA:  CBC and BMET pending.   ASSESSMENT AND PLAN:  1. Acute cellulitis of the right knee:  He will be referred to [facility] for admission and IV antibiotic coverage given reported history     of similar occurrence with surgical outcome on his contralateral knee in     the past.  Surgery will be consulted for a possible tap versus I&D of his     knee.  Empirically he will be begun on IV Rocephin to cover both gram     negatives and gram  positives, holding off on vancomycin unless culture     data suggests MRSA.  2. Hypertension:  This has been reasonably well controlled.  Likely elevated     secondary to acute pain and illness.  He will be continued on his home     medication.  3. History of depression:  He will be continued on his home medication.     This appears to be at baseline.                                               Rene Paci, M.D. [facility]    VL/MEDQ  D:  05/18/2003  T:  05/18/2003  Job:  [id number]

## 2011-05-09 NOTE — Discharge Summary (Signed)
NAME:  Jerome Hernandez, Jerome Hernandez                       ACCOUNT NO.:  [id number]   MEDICAL RECORD NO.:  [id number]                   PATIENT TYPE:  INP   LOCATION:  0448                                 FACILITY:  [facility]   PHYSICIAN:  Rene Paci, M.D. [facility]          DATE OF BIRTH:  1956-03-22   DATE OF ADMISSION:  05/04/2003  DATE OF DISCHARGE:  05/08/2003                                 DISCHARGE SUMMARY   DISCHARGE DIAGNOSES:  1. Staphylococcus aureus cellulitis of the right knee, status post incision     and drainage of abscess.  2. History of hypertension.  3. History of depression.  4. History of cellulitis involving left knee three years ago per patient     report.   DISCHARGE MEDICATIONS:  1. Levaquin 500 mg p.o. daily x5 additional days to complete total 10 day     course.  2. Vicodin 5/500 one to two p.o. q.6h p.r.n.  pain.   Other medications are as prior to admission and include Norvasc 5 mg p.o.  daily, Paxil 20 mg daily, Pepcid 20 mg daily.   Hospital followup is with his primary care physician Jerome Hernandez, M.D.  [facility] for this Friday. May 21.  The patient agrees to call for appointment so  that he may arrange time.  If he becomes febrile or has worsening pain or  swelling before that time, he is to return to the emergency room for further  evaluation.   Hospital consults include general surgery Dr. Zachery Dakins who performed I&D.  On the day of admission, May 13, abscess culture grew Staphylococcus aureus,  penicillin resistant but sensitive to fluoroquinolones, cephalosporins.   CONDITION ON DISCHARGE:  Medically stable and improved.   BRIEF HISTORY AND PHYSICAL:  Jerome Hernandez is a 55 year old gentleman with  reported history of cellulitis in the left knee requiring surgery three  years ago who presented to his primary care physician's office on the day of  admission with new onset redness, swelling and warmth of the right knee.  He  reported this occurred  approximately 24 hours after he had squeezed an  ingrown hair pimple.  Because of the history of previous surgery and IV  antibiotic requiring cellulitis in the opposite knee he was admitted to the  medical unit of [facility] and obtained  a surgical consult.  Dr.  Zachery Dakins saw the patient and then performed an I&D of the small abscess at  the bedside.  I&D culture grew Staphylococcus aureus, penicillin resistant  but otherwise sensitive to all antibiotics.  The patient had been treated  with IV Rocephin since the time of his admission and was afebrile during  this hospital admission.  On the day prior to discharge he was changed to  oral antibiotics, initially Keflex.  This will be changed to a  fluoroquinolone to provide better coverage.  He will continue for another  five days worth of antibiotic treatment to  complete a 10-day course of  treatment.  Prior to discontinuing these antibiotics he will be seen by his  primary care physician for further followup.  The patient agrees to call and  make this appointment.  His pain is controlled with p.r.n. Vicodin and he is  ambulatory without difficulty at the time of discharge.   OTHER CHRONIC ISSUES:  His hypertension and depression remained well  controlled on his usual home medications.  No changes were made on these  regimens.    LABORATORY DATA AT DISCHARGE:  White count 8.4, hemoglobin 15.2, platelets  303. CMET within normal limits.                                                Rene Paci, M.D. [facility]    VL/MEDQ  D:  05/08/2003  T:  05/08/2003  Job:  [id number]

## 2011-05-09 NOTE — Procedures (Signed)
NAME:  Jerome Hernandez, Jerome Hernandez             ACCOUNT NO.:  [id number]   MEDICAL RECORD NO.:  [id number]          PATIENT TYPE:  OUT   LOCATION:  SLEEP CENTER                 FACILITY:  [facility]   PHYSICIAN:  Marcelyn Bruins, M.D. [facility] DATE OF BIRTH:  04/07/1956   DATE OF STUDY:  01/01/2005                              NOCTURNAL POLYSOMNOGRAM   REFERRING PHYSICIAN:  Laurita Quint, MD.   INDICATION FOR THE STUDY:  Hypersomnia with sleep apnea.   SLEEP ARCHITECTURE:  The patient had a total sleep time of 398 minutes with  very decreased REM, and the patient never achieved slow wave sleep. Sleep  onset latency was normal, and REM latency was greatly prolonged.   IMPRESSION:  1.  Split night study reveals moderate obstructive sleep apnea/hypopnea      syndrome with 83 obstructive events noted in the first 177 minutes of      sleep. This gave the patient a respiratory disturbance index of 28      events per hour. Events were not positional, but they were associated      with moderate to loud snoring. By protocol, the patient was placed in      the small Comfort gel mask, and CPAP titration was initiated. At a      pressure of approximately 8 to10, the patient seemed to do the best with      some breakthrough noted. There was a question of mouth opening with air      leak, and perhaps the patient should try a chin strap for a full face      mask. This may have been part of the reason for the breakthrough events      later in the study.  2.  No clinically significant cardiac arrhythmias.  3.  Small to moderate numbers of leg jerks with very mild sleep disruption.      KC/MEDQ  D:  01/08/2005 12:39:26  T:  01/08/2005 14:15:16  Job:  [id number]

## 2011-05-09 NOTE — Discharge Summary (Signed)
[facility]  Patient:    Jerome Hernandez, Jerome Hernandez                    MRN: [id number] Adm. Date:  [id number] Disc. Date: 12/31/00 Attending:  Katha Cabal CC:         Ulyess Mort, M.D. [facility]   Discharge Summary  ADMISSION DIAGNOSIS:  Gastroesophageal reflux disease with hiatal hernia.  DISCHARGE DIAGNOSIS:  Gastroesophageal reflux disease with hiatal hernia, status post a laparoscopic Nissen fundoplication with repair of hiatal hernia.  HOSPITAL COURSE:  Mr. Newby is a 55 year old gentleman who was an a.m. admission on December 29, 2000, and underwent a laparoscopic Nissen fundoplication.  This was done over a #50 dilator, placing figure-of-eight sutures in the diaphragm for closure.  The patient tolerated the procedure well.  We monitored him for nausea and vomiting in the perioperative period. His vitals remained stable.  He began taking liquids on December 30, 2000, without nausea, and by December 31, 2000, was tolerating them well and was ready for discharge.  DISCHARGE MEDICATIONS:  Mepergan Fortis p.r.n. pain.  FOLLOWUP:  He was advised to come back to the office in 3 to 4 weeks.  CONDITION ON DISCHARGE:  Good. DD:  12/31/00 TD:  12/31/00 Job: [id number]

## 2011-05-09 NOTE — Procedures (Signed)
[facility]  Patient:    Jerome Hernandez, Jerome Hernandez                    MRN: [id number] Proc. Date: 11/10/00 Adm. Date:  [id number] Disc. Date: [id number] Attending:  Charmaine Downs CC:         [place] Daphine Deutscher, M.D.  Ulyess Mort, M.D. [facility]   Procedure Report  PROCEDURE:  Outpatient esophageal manometry.  DESCRIPTION OF PROCEDURE:  Esophageal manometry was completed on November 10, 2000, and results are as follows:  1. Upper esophageal sphincter:  There appears to be normal coordination    between pharyngeal contraction and cricopharyngeal relaxation. 2. Lower esophageal sphincter:  Mean pressure is borderline at 15 mmHg with    normal relaxation and swallowing.  Lower esophageal sphincter appears to    extend from the 41 to the 45 cm level. 3. Motility pattern:  There are normally-propagated peristaltic waves    throughout the length of the esophagus.  Mean amplitude of contractions is    65 mmHg.  Approximately 80% of the waves are peristaltic in nature.  ASSESSMENT:  This is a fairly unremarkable manometry except for borderline incompetent lower esophageal sphincter. DD:  11/27/00 TD:  11/27/00 Job: [id number]

## 2011-05-09 NOTE — Discharge Summary (Signed)
NAME:  Jerome Hernandez, Jerome Hernandez                       ACCOUNT NO.:  [id number]   MEDICAL RECORD NO.:  [id number]                   PATIENT TYPE:  IPS   LOCATION:  0501                                 FACILITY:  [facility]   PHYSICIAN:  Geoffery Lyons, MD                     DATE OF BIRTH:  06-15-1956   DATE OF ADMISSION:  07/20/2002  DATE OF DISCHARGE:  07/23/2002                                 DISCHARGE SUMMARY   CHIEF COMPLAINT AND PRESENT ILLNESS:  This was the first admission to [facility] for this 55 year old married white male voluntarily  admitted.  Stopped Paxil one month prior to this admission, trying to wean  off with his physician's approval.  The wife had a hypomanic episode, acted  out with another man.  The patient got very stressed, developed some  suicidal ideas with a plan to kill himself by not taking antihypertensive  medication.  Cites very _________ family, management responsibilities at  home and relationship with wife.   PAST PSYCHIATRIC HISTORY:  Dr. Ilsa Iha.  Paxil for two years with good  results.   ALCOHOL/DRUG HISTORY:  Denies.   PAST MEDICAL HISTORY:  Migraine and hypertension.   MEDICATIONS:  Norvasc 5 mg daily.   PHYSICAL EXAMINATION:  Performed and failed to show any acute findings.   MENTAL STATUS EXAM:  Fully alert, medium-built, blunted affect male,  apathetic, emotionally withdrawn.  Speech normal, not spontaneous.  Mood  depressed.  Affect depressed.  Thought processes positive for suicidal  ruminations.  Contracts for safety.  No evidence of psychosis.  No homicidal  ideation.  Cognition well-preserved.   ADMISSION DIAGNOSES:   AXIS I:  Major depression, recurrent.   AXIS II:  No diagnosis.   AXIS III:  Hypertension.   AXIS IV:  Moderate.   AXIS V:  Global Assessment of Functioning upon admission 32; highest Global  Assessment of Functioning in the last year 74.   LABORATORY DATA:  CBC was within normal limits.  Blood  chemistries were  within normal limits.  Thyroid profile was within normal limits.   HOSPITAL COURSE:  He was admitted and started intensive individual and group  psychotherapy.  He was restarted on Paxil but he did endorse that he had  side effects to it, sexual dysfunction, that was the reason why he had this  discontinued.  We went ahead and held the Paxil and started Wellbutrin SR  150 mg in the morning.  There was a meeting with the patient and his wife.  They both agreed to go into therapy.  They discussed some of the issues in  terms of coping.  By July 23, 2002, he was in full contact with reality.  Felt he could take it from here.  No suicidal ideation.  No homicidal  ideation.  Wanted to pursue the Wellbutrin.  Is going to follow up with Dr.  Snyder.  As there were no suicidal or homicidal ideation, we discharged to  outpatient treatment.   DISCHARGE DIAGNOSES:   AXIS I:  Major depression, recurrent.   AXIS II:  No diagnosis.   AXIS III:  Hypertension.   AXIS IV:  Moderate.   AXIS V:  Global Assessment of Functioning on discharge 55-60.   DISCHARGE MEDICATIONS:  1. Wellbutrin SR 150 mg twice a day.  2. Norvasc 5 mg daily.  3. Ambien at bedtime for sleep.                                               Geoffery Lyons, MD    IL/MEDQ  D:  08/24/2002  T:  08/24/2002  Job:  [id number]

## 2011-05-09 NOTE — Op Note (Signed)
[facility]  Patient:    Jerome Hernandez, Jerome Hernandez                    MRN: [id number] Proc. Date: 12/29/00 Adm. Date:  [id number] Attending:  Katha Cabal CC:         Ulyess Mort, M.D. [facility]  Roxy Manns, M.D. [facility]   Operative Report  CCS NUMBER: [id number]  PREOPERATIVE DIAGNOSIS:   Gastroesophageal reflux disease with hiatal hernia.  POSTOPERATIVE DIAGNOSIS:  Gastroesophageal reflux disease with hiatal hernia.  OPERATION:  Laparoscopic Nissen fundoplication with closure of the esophageal hiatus.  SURGEON:  Thornton Park. Daphine Deutscher, M.D.  ASSISTANT:  Sharlet Salina T. Hoxworth, M.D.  INDICATIONS:  Jerome Hernandez is a 55 year old male who works night shift but has a lot of reflux during the day.  He has had an upper endoscopy which showed esophageal erosions and a stricture with a 3-4 cm sliding hiatal hernia. Proton pump inhibitors improved his symptoms.  Esophageal manometry was normal except for lower decreased pressures in the lower esophageal sphincter. Preoperative informed consent was obtained regarding potential complications and failure.  He is brought to the operating room for it at this time.  DESCRIPTION OF PROCEDURE:  Laparoscopic Nissen fundoplication with closure of the esophageal crura using two figure-of-eight sutures and one simple suture and with laparoscopic Nissen fundoplication performed over #50 dilator using a standard three suture technique.  Jerome Hernandez was taken to operating room #1 on December 29, 2000, and given general anesthesia.  The abdomen was prepped with Betadine and draped sterilely.  There was a little area of excoriation down in his umbilicus which was prepped and subsequently sealed off with an Op-Site.  I made my first incision above the umbilicus and then through the midline made an incision and through a pursestring suture began using the Hasson technique and entered the abdomen.  Once insufflated, the abdomen was  surveyed with the 30 degree scope. No abnormalities were noted.  The 5 mm port was placed in the upper midline through which the Squiggly retractor was placed to retract the left lateral segment.  Two 10-11s were placed in the right side and one in the left upper quadrant.  This set up the operation for me to first take down the gastrohepatic ligament and expose the right crus.  I then carried this anteriorly and dissected the distal portion of the esophagus and came down to the left crus.  Posteriorly I began opening up the window and identified the left crus but there was still a lot of fat on the other side.  I then went over on the greater curvature side and took down short gastrics using the Harmonic scalpels.  I marched up the fundus to the cardia and did the dissection of the left crus from that side.  This then enabled me to go back from the other side and easily enlarge a window.  I put a Penrose drain behind the esophagus, and we used that to retract.  There was a lipoma snuck up along the esophagus which we pulled down into the stomach.  We nicely delineated the posterior vagus.  We did not disturb the anterior vagus.  Next, I closed the esophageal hiatus using two figure-of-eight sutures using the endostitch more posteriorly and then completed this closure with a simple suture up near the esophagus.  This left ample room for the esophagus.  The stomach was easily grasped and came over without any tension behind the esophagus.  The #50 dilator was passed without difficulty by Dr. Council Mechanic.  I then sutured the Nissen fundoplication using three sutures, the superior most placed and tied extracorporeally.  I got a good purchase of stomach and esophagus and wrapped stomach and tied this band on extracorporeally.  This was followed by two nicely placed stitches tied intracorporeally again getting stomach, esophagus and wrapped stomach.  These were all tied and all had clips placed  on them for subsequent radiographic identification if necessary.  The wrapped portion of the stomach was then tacked to the right crus.  A nicely healthy wrap was present.  No bleeding was noted.  Picture is included in the chart.  The patient seemed to tolerate the procedure well.  As we removed the trocars we inspected for bleeding and none was seen.  I did repair the umbilical port with an extra suture of 0 Vicryl under laparoscopic vision and then watched the rest of the ports and deflated the abdomen.  The wounds were injected with Marcaine and closed with 4-0 Vicryl with Benzoin and Steri-Strips.  The patient seemed to tolerate the procedure well and was taken to the recovery room in satisfactory condition. DD:  12/29/00 TD:  12/29/00 Job: [id number]

## 2011-05-09 NOTE — H&P (Signed)
NAME:  Jerome Hernandez, Jerome Hernandez                       ACCOUNT NO.:  [id number]   MEDICAL RECORD NO.:  [id number]                   PATIENT TYPE:  IPS   LOCATION:  0501                                 FACILITY:  [facility]   PHYSICIAN:  Geoffery Lyons, MD                     DATE OF BIRTH:  [birth year]   DATE OF ADMISSION:  07/20/2002  DATE OF DISCHARGE:  07/23/2002                         PSYCHIATRIC ADMISSION ASSESSMENT   PATIENT IDENTIFICATION:  This is a 55 year old married Caucasian male who is  voluntary.   HISTORY OF PRESENT ILLNESS:  This patient stopped his Paxil approximately  one month ago, trying to wean himself off of it with his M.D.'s approval,  then his wife had a hypomanic episode and acted out, having an affair with  another man.  The patient is considerably stressed by this and felt that he  had been stressed by the situation at home for about a month.  He feel  suicidal thoughts with a plan to kill himself by overdosing on his  hypertension medication.  He also cites the burden of not only having to  worry about his wife and her hypomanic episode, but he is responsible for  most of the home and family management and feels that he has more  responsibilities and is unable to rely on his wife to help him with these.  He denies any auditory and visual hallucinations or symptoms of mania  himself.  He also denies any feelings of paranoia.   PAST PSYCHIATRIC HISTORY:  The patient is followed by Dr. Valente David.  He  has been on Paxil for approximately three years with good results.  He does  have some history of head tremor, but had a negative workup for it.  This is  his first admission to [facility].   SUBSTANCE ABUSE HISTORY:  The patient denies any substance abuse.   PAST MEDICAL HISTORY:  The patient is followed by [facility].  Medical problems include head tremor, not otherwise specified.  Past medical  history is remarkable for history of  migraine headaches but none recently.   MEDICATIONS:  Norvasc 5 mg p.o. daily for his head tremor.   DRUG ALLERGIES:  None.   PHYSICAL EXAMINATION:  GENERAL:  The patient's physical examination is  documented in the progress notes and is generally nonfocal.  VITAL SIGNS:  Normal.   LABORATORY DATA:  Basic laboratory studies are unremarkable, all within  normal limits.   SOCIAL HISTORY:  The patient is married; this is his second marriage.  He  has four children ages 5, 1, 78, and 5.  He is also employed full-time  with steady employment.  He reports family stressors of difficulty with his  wife's mood disorder and heavy responsibility for family and home  organization.   FAMILY HISTORY:  He denies any history of substance abuse or mental illness.  MENTAL STATUS EXAM:  This is a fully alert, medium-built male who is with a  blunted affect with a apathetic manner.  He is emotionally withdrawn.  Speech is normal, no spontaneity.  Mood is depressed and somewhat apathetic,  feels somewhat hopeless about his situation.  Thought process is remarkable  for suicidal thoughts without any specific plan but he is able to contract  for safety.  His thinking is otherwise logical and within deficits.  His  thought content is dominated by his frustration with his wife and feeling  overwhelmed with all of his family responsibilities and feeling somewhat  lonely in his marriage.  Cognitive: Intact and oriented x 3.  Intelligence  is average to above average.  Judgment and impulse control: Within normal  limits.  Insight: Fair.   ADMISSION DIAGNOSES:   AXIS I:  Major depression, recurrent, severe.   AXIS II:  Deferred.   AXIS III:  1. Head tremor, not otherwise specified.  2. Hypertension.   AXIS IV:  Moderate marital and family stress.   AXIS V:  Current 32, past year 79.   INITIAL PLAN OF CARE:  Plan is to voluntarily admit the patient with q.68m.  checks in place with our goal to  alleviate his suicidal ideation.  We are  going to start him back on Paxil CR 12.5 mg daily since he has had good  results with that and we will start him today.  We will also continue his  Norvasc 5 mg daily.  In addition to that, we will provide him with Ambien 10  mg p.o. q.h.s.      Margaret A. Scott, N.P.                   Geoffery Lyons, MD    MAS/MEDQ  D:  09/06/2002  T:  09/07/2002  Job:  [id number]

## 2011-05-27 ENCOUNTER — Encounter: Payer: Self-pay | Admitting: Family Medicine

## 2011-05-28 ENCOUNTER — Ambulatory Visit (INDEPENDENT_AMBULATORY_CARE_PROVIDER_SITE_OTHER): Payer: 59 | Admitting: Family Medicine

## 2011-05-28 ENCOUNTER — Encounter: Payer: Self-pay | Admitting: Family Medicine

## 2011-05-28 DIAGNOSIS — M5412 Radiculopathy, cervical region: Secondary | ICD-10-CM

## 2011-05-28 DIAGNOSIS — M5410 Radiculopathy, site unspecified: Secondary | ICD-10-CM

## 2011-05-28 DIAGNOSIS — R413 Other amnesia: Secondary | ICD-10-CM

## 2011-05-28 DIAGNOSIS — R29810 Facial weakness: Secondary | ICD-10-CM

## 2011-05-28 NOTE — Patient Instructions (Signed)
REFERRAL: GO THE THE FRONT ROOM AT THE ENTRANCE OF OUR CLINIC, NEAR CHECK IN. ASK FOR MARION. SHE WILL HELP YOU SET UP YOUR REFERRAL. DATE: TIME:  STOP ALL THE GOODY'S   Take an Aspirin 81 mg, coated

## 2011-05-28 NOTE — Progress Notes (Signed)
Jerome Hernandez, a 55 y.o. male presents today in the office for the following:  Multiple issues including LEFT radicular symptoms on the LEFT arm, LEFT leg, facial drooping and weakness on the LEFT side, eyelid drooping, some difficulty with words and speech formation, LEFT arm numbness, LEFT finger occasional numbness.  Left leg radiculopathy  12/13/2010. Dr. Gerrit Heck. Operative intervention of the lumbar spine. A couple of weeks ago, at work, lifting something out of a box, and then got a lot of pain and pain radiatiing down his leg. Up until that point, his back has been feeling well.  2 weeks or more, ? History of facial drooping.  Stopped pain medication - Nucynta, and feeling bad. Mrs. Greater than one month ago however.  Eyelid drooping. He noticed this, as well as some colleagues. Trouble with words some. Questionable memory changes, minor Left arm numb some as well as left leg.  No significant headache. No photophobia. 5 Goody powder's a day now  Patient Active Problem List  Diagnoses  . DEPRESSION  . OBSTRUCTIVE SLEEP APNEA  . BACK PAIN  . HYPERTENSION, BENIGN  . BENIGN PROSTATIC HYPERTROPHY, WITH URINARY OBSTRUCTION  . Facial weakness  . Radiculopathy of arm  . Memory change   Past Medical History  Diagnosis Date  . Chronic back pain     workman's comp w/u and pain clinic in the past  . Acute prostatitis 02/2010  . Cancer     h/o prostate, father  . DDD (degenerative disc disease)     surgery   Past Surgical History  Procedure Date  . Esophagogastric fundoplication 2002  . Inguinal hernia repair 1996    mesh on second surgery  . Appendectomy 1998  . Incise and drain abcess     both knees  . Spine surgery     L4-5 partial discectomy with spur removal   History  Substance Use Topics  . Smoking status: Never Smoker   . Smokeless tobacco: Not on file  . Alcohol Use: No   Family History  Problem Relation Age of Onset  . Cancer Father     prostate    Allergies  Allergen Reactions  . Bupropion Hcl     REACTION: shaky   Current Outpatient Prescriptions on File Prior to Visit  Medication Sig Dispense Refill  . amLODipine (NORVASC) 10 MG tablet Take 10 mg by mouth daily.        . Aspirin-Acetaminophen (GOODY BODY PAIN) 500-325 MG PACK OTC as directed       . PARoxetine (PAXIL) 20 MG tablet Take 20 mg by mouth daily.        Marland Kitchen DISCONTD: Tapentadol HCl (NUCYNTA) 75 MG TABS Take 1 tablet by mouth 3 (three) times daily.         REVIEW OF SYSTEMS  GEN: No fevers, chills. Nontoxic. As above MSK: Detailed in the HPI GI: tolerating PO intake without difficulty Neuro: detailed above Otherwise the pertinent positives of the ROS are noted above.    Physical Exam  Blood pressure 120/84, pulse 72, temperature 98.6 F (37 C), weight 164 lb 8 oz (74.617 kg).  GEN: WDWN, NAD, Non-toxic, A & O x 3 HEENT: Atraumatic, Normocephalic. Neck supple. No masses, No LAD. Mild facial droop and eyelid sag on L Ears and Nose: No external deformity. CV: RRR, No M/G/R. No JVD. No thrill. No extra heart sounds. PULM: CTA B, no wheezes, crackles, rhonchi. No retractions. No resp. distress. No accessory muscle use. ABD: S, NT,  ND, +BS. No rebound tenderness. No HSM.  EXTR: No c/c/e Neuro: CN 2-12 grossly intact. PERRLA. EOMI. Visual fields intact. Sensation: DECREASED DIFFUSELY ON L HAND, GROSS TOUCH. Str 5/5 all extremities. DTR 2+. No clonus. A and o x 4. Romberg neg. Finger nose neg. Heel -shin neg.  PSYCH: Normally interactive. Conversant. Not depressed or anxious appearing.  Calm demeanor.   A/P: 55 year old male with 2 week history of left-sided arm radiculopathy symptoms, LEFT leg radiculopathy symptoms, facial drooping, eyelid drooping, difficulty with memory, fumbling of some words.  Clinical concern for potential stroke. Discontinue Goody powders. Too much ASA. Will obtain an MRI of the brain without contrast to evaluate for potential stroke.   81  mg ASA now.   Will address potential cervical spine or lumbar issues after potential intracranial evaluation complete.

## 2011-05-29 ENCOUNTER — Ambulatory Visit
Admission: RE | Admit: 2011-05-29 | Discharge: 2011-05-29 | Disposition: A | Discharge status: Home / Self Care | Payer: 59 | Source: Ambulatory Visit | Attending: Family Medicine | Admitting: Family Medicine

## 2011-05-29 DIAGNOSIS — M5410 Radiculopathy, site unspecified: Secondary | ICD-10-CM

## 2011-05-29 DIAGNOSIS — R413 Other amnesia: Secondary | ICD-10-CM

## 2011-05-29 DIAGNOSIS — R29810 Facial weakness: Secondary | ICD-10-CM

## 2011-05-30 ENCOUNTER — Other Ambulatory Visit (INDEPENDENT_AMBULATORY_CARE_PROVIDER_SITE_OTHER): Payer: 59 | Admitting: Family Medicine

## 2011-05-30 DIAGNOSIS — R413 Other amnesia: Secondary | ICD-10-CM

## 2011-05-30 DIAGNOSIS — R29810 Facial weakness: Secondary | ICD-10-CM

## 2011-05-30 LAB — BASIC METABOLIC PANEL
BUN: 16 mg/dL (ref 6–23)
Creatinine, Ser: 0.8 mg/dL (ref 0.4–1.5)
GFR: 101.61 mL/min (ref >60.00)
Potassium: 4 mEq/L (ref 3.5–5.1)

## 2011-05-30 LAB — VITAMIN B12: Vitamin B-12: 341 pg/mL (ref 211–911)

## 2011-10-29 ENCOUNTER — Other Ambulatory Visit: Payer: Self-pay | Admitting: *Deleted

## 2011-10-29 MED ORDER — AMLODIPINE BESYLATE 10 MG PO TABS: 10.0000 mg | ORAL_TABLET | Freq: Every day | ORAL | Status: DC

## 2011-10-29 NOTE — Telephone Encounter (Signed)
[facility] [place].  They say insurance requires a 90 day supply.  Pt last saw in in march of 2012.

## 2011-10-30 MED ORDER — PAROXETINE HCL 20 MG PO TABS: 20.0000 mg | ORAL_TABLET | Freq: Every day | ORAL | Status: DC

## 2011-10-30 NOTE — Telephone Encounter (Signed)
Will refill electronically  

## 2012-02-24 ENCOUNTER — Telehealth: Payer: Self-pay | Admitting: Gastroenterology

## 2012-03-03 NOTE — Telephone Encounter (Signed)
Recall Project: Left vmail on home number; letter mailed

## 2012-03-15 ENCOUNTER — Encounter: Payer: Self-pay | Admitting: Gastroenterology

## 2012-05-18 ENCOUNTER — Other Ambulatory Visit: Payer: Self-pay | Admitting: Family Medicine

## 2012-05-19 ENCOUNTER — Other Ambulatory Visit: Payer: Self-pay | Admitting: *Deleted

## 2012-05-19 MED ORDER — AMLODIPINE BESYLATE 10 MG PO TABS: 10.0000 mg | ORAL_TABLET | Freq: Every day | ORAL | Status: DC

## 2012-05-19 NOTE — Telephone Encounter (Signed)
Opened in error

## 2012-05-21 ENCOUNTER — Telehealth: Payer: Self-pay

## 2012-05-21 NOTE — Telephone Encounter (Signed)
Received faxed PA request from [facility]. Called [phone] to start PA; spoke with Toniann Fail since workers comp [facility] but she started process and will notify [facility] after sending to adjuster. Bill at [facility] notified.

## 2012-08-19 ENCOUNTER — Other Ambulatory Visit: Payer: Self-pay | Admitting: Family Medicine

## 2012-08-19 NOTE — Telephone Encounter (Signed)
Ok to refil until seen

## 2012-08-19 NOTE — Telephone Encounter (Signed)
Pt left v/m requesting Amlodipine refill until seen 08/25/12.Please advise.

## 2012-08-19 NOTE — Telephone Encounter (Signed)
Request for amlodipine 10 mg.#90 I  left message on patient vm to call and schedule appt for follow up for medication. Ok to refill?

## 2012-08-19 NOTE — Telephone Encounter (Signed)
Can refil until seen

## 2012-08-25 ENCOUNTER — Ambulatory Visit (INDEPENDENT_AMBULATORY_CARE_PROVIDER_SITE_OTHER): Payer: 59 | Admitting: Family Medicine

## 2012-08-25 ENCOUNTER — Encounter: Payer: Self-pay | Admitting: Family Medicine

## 2012-08-25 VITALS — BP 118/72 | HR 89 | Temp 97.9°F | Ht 67.0 in | Wt 170.0 lb

## 2012-08-25 DIAGNOSIS — I10 Essential (primary) hypertension: Secondary | ICD-10-CM

## 2012-08-25 DIAGNOSIS — M549 Dorsalgia, unspecified: Secondary | ICD-10-CM

## 2012-08-25 DIAGNOSIS — Z23 Encounter for immunization: Secondary | ICD-10-CM

## 2012-08-25 LAB — COMPREHENSIVE METABOLIC PANEL
Albumin: 4 g/dL (ref 3.5–5.2)
Alkaline Phosphatase: 59 U/L (ref 39–117)
BUN: 15 mg/dL (ref 6–23)
CO2: 27 mEq/L (ref 19–32)
Calcium: 8.7 mg/dL (ref 8.4–10.5)
GFR: 91.05 mL/min (ref >60.00)
Glucose, Bld: 117 mg/dL — ABNORMAL HIGH (ref 70–99)
Potassium: 3.2 mEq/L — ABNORMAL LOW (ref 3.5–5.1)

## 2012-08-25 LAB — CBC WITH DIFFERENTIAL/PLATELET
Eosinophils Relative: 0.7 % (ref 0.0–5.0)
HCT: 45.9 % (ref 39.0–52.0)
Lymphocytes Relative: 17.2 % (ref 12.0–46.0)
Monocytes Relative: 8.3 % (ref 3.0–12.0)
Neutrophils Relative %: 73.1 % (ref 43.0–77.0)
Platelets: 274 10*3/uL (ref 150.0–400.0)
WBC: 7.3 10*3/uL (ref 4.5–10.5)

## 2012-08-25 LAB — TSH: TSH: 0.8 u[IU]/mL (ref 0.35–5.50)

## 2012-08-25 LAB — LIPID PANEL
Cholesterol: 161 mg/dL (ref 0–200)
VLDL: 39.6 mg/dL (ref 0.0–40.0)

## 2012-08-25 MED ORDER — AMLODIPINE BESYLATE 10 MG PO TABS: 10.0000 mg | ORAL_TABLET | Freq: Every day | ORAL | Status: DC

## 2012-08-25 NOTE — Assessment & Plan Note (Signed)
Stable overall  Mild wt gain - unable to exercise due to severe back pain  Lab today Continue amlodipine Update Tdap today as well  bp in fair control at this time  No changes needed  Disc lifstyle change with low sodium diet and exercise

## 2012-08-25 NOTE — Progress Notes (Signed)
Subjective:    Patient ID: Jerome Hernandez, male    DOB: 1956-05-31, 56 y.o.   MRN: [id number]  HPI Has been feeling ok overall - dealing with chronic back pain  Saw a surgeon and did not think surgery would help- severe scoliosis and deg discs Goes to [facility]- gets nucynta   bp is stable today  No cp or palpitations or headaches or edema  No side effects to medicines  BP Readings from Last 3 Encounters:  08/25/12 118/72  05/28/11 120/84  03/03/11 128/88     Wt is up 6 lb - from last visit -- and bmi is 26 Drinks a lot of water-no soda or sugar drinks Diet is fair   His company changed - but no big impact on him   Amlodipine 10 mg  No problems   Needs his labs drawn Cannot get any exercise with back   PT and shots did not help   Patient Active Problem List  Diagnosis  . DEPRESSION  . OBSTRUCTIVE SLEEP APNEA  . BACK PAIN  . HYPERTENSION, BENIGN  . BENIGN PROSTATIC HYPERTROPHY, WITH URINARY OBSTRUCTION  . Facial weakness  . Radiculopathy of arm  . Memory change   Past Medical History  Diagnosis Date  . Chronic back pain     workman's comp w/u and pain clinic in the past  . Acute prostatitis 02/2010  . Cancer     h/o prostate, father  . DDD (degenerative disc disease)     surgery   Past Surgical History  Procedure Date  . Esophagogastric fundoplication 2002  . Inguinal hernia repair 1996    mesh on second surgery  . Appendectomy 1998  . Incise and drain abcess     both knees  . Spine surgery     L4-5 partial discectomy with spur removal   History  Substance Use Topics  . Smoking status: Never Smoker   . Smokeless tobacco: Not on file  . Alcohol Use: No   Family History  Problem Relation Age of Onset  . Cancer Father     prostate   Allergies  Allergen Reactions  . Bupropion Hcl     REACTION: shaky   Current Outpatient Prescriptions on File Prior to Visit  Medication Sig Dispense Refill  . amLODipine (NORVASC) 10 MG tablet Take 1  tablet (10 mg total) by mouth daily.  30 tablet  0  . amLODipine (NORVASC) 10 MG tablet TAKE 1 TABLET (10 MG TOTAL) BY MOUTH DAILY.  90 tablet  0  . Aspirin-Acetaminophen (GOODY BODY PAIN) 500-325 MG PACK OTC as directed       . PARoxetine (PAXIL) 20 MG tablet Take 1 tablet (20 mg total) by mouth daily.  90 tablet  3       Review of Systems Review of Systems  Constitutional: Negative for fever, appetite change, fatigue and unexpected weight change.  Eyes: Negative for pain and visual disturbance.  Respiratory: Negative for cough and shortness of breath.   Cardiovascular: Negative for cp or palpitations    Gastrointestinal: Negative for nausea, diarrhea and constipation.  Genitourinary: Negative for urgency and frequency.  Skin: Negative for pallor or rash   MSK pos for chronic back pain  Neurological: Negative for weakness, light-headedness,  and headaches.  Hematological: Negative for adenopathy. Does not bruise/bleed easily.  Psychiatric/Behavioral: Negative for dysphoric mood. The patient is not nervous/anxious.         Objective:   Physical Exam  Constitutional: He appears  well-developed and well-nourished. No distress.  HENT:  Head: Normocephalic and atraumatic.  Mouth/Throat: Oropharynx is clear and moist.  Eyes: Conjunctivae and EOM are normal. Pupils are equal, round, and reactive to light. No scleral icterus.  Neck: Normal range of motion. Neck supple. No JVD present. Carotid bruit is not present. No thyromegaly present.  Cardiovascular: Normal rate, regular rhythm, normal heart sounds and intact distal pulses.  Exam reveals no gallop.   Pulmonary/Chest: Effort normal and breath sounds normal. No respiratory distress. He has no wheezes.  Abdominal: Soft. Bowel sounds are normal. He exhibits no distension and no abdominal bruit. There is no tenderness.  Musculoskeletal: He exhibits no edema.       Poor rom back  Lymphadenopathy:    He has no cervical adenopathy.    Neurological: He is alert. He has normal reflexes. He displays no tremor. He exhibits normal muscle tone. Coordination normal.  Skin: Skin is warm and dry. No rash noted. No erythema. No pallor.  Psychiatric: He has a normal mood and affect.          Assessment & Plan:

## 2012-08-25 NOTE — Patient Instructions (Addendum)
Try to eat a low sodium and low fat diet - keep drinking water  Lab today  Tetanus shot today

## 2012-08-26 NOTE — Assessment & Plan Note (Signed)
On nucynta from [facility] clinic- is chroinic with radiculopathy and failed consv tx

## 2012-10-19 ENCOUNTER — Other Ambulatory Visit: Payer: Self-pay | Admitting: Family Medicine

## 2012-11-15 ENCOUNTER — Other Ambulatory Visit: Payer: Self-pay | Admitting: *Deleted

## 2012-11-15 MED ORDER — PAROXETINE HCL 20 MG PO TABS: 20.0000 mg | ORAL_TABLET | Freq: Every day | ORAL | Status: DC

## 2013-01-31 ENCOUNTER — Ambulatory Visit (INDEPENDENT_AMBULATORY_CARE_PROVIDER_SITE_OTHER): Payer: 59 | Admitting: Family Medicine

## 2013-01-31 ENCOUNTER — Telehealth: Payer: Self-pay | Admitting: Family Medicine

## 2013-01-31 ENCOUNTER — Encounter: Payer: Self-pay | Admitting: Family Medicine

## 2013-01-31 VITALS — BP 146/98 | HR 92 | Temp 98.9°F | Ht 67.0 in | Wt 169.2 lb

## 2013-01-31 DIAGNOSIS — E876 Hypokalemia: Secondary | ICD-10-CM | POA: Insufficient documentation

## 2013-01-31 DIAGNOSIS — M5137 Other intervertebral disc degeneration, lumbosacral region: Secondary | ICD-10-CM

## 2013-01-31 DIAGNOSIS — I10 Essential (primary) hypertension: Secondary | ICD-10-CM

## 2013-01-31 MED ORDER — POTASSIUM CHLORIDE CRYS ER 20 MEQ PO TBCR: 20.0000 meq | EXTENDED_RELEASE_TABLET | Freq: Every day | ORAL | Status: AC

## 2013-01-31 MED ORDER — PAROXETINE HCL 20 MG PO TABS: 20.0000 mg | ORAL_TABLET | Freq: Every day | ORAL | Status: DC

## 2013-01-31 NOTE — Patient Instructions (Addendum)
I refilled paxil today  Follow up with surgeon as planned  Use ice and heat on back / stretches as well  Stay on bp medicine  Lab today for potassium

## 2013-01-31 NOTE — Assessment & Plan Note (Signed)
Will continue with plan for surgeon f/u  S/p partial discectomy L4-5 in the past  Getting through w/d from nucynta and overall doing well  Will see how pain is without it

## 2013-01-31 NOTE — Progress Notes (Signed)
Subjective:    Patient ID: Jerome Hernandez, male    DOB: 1956-01-06, 57 y.o.   MRN: [id number]  HPI Here for f/u of chronic medical problems  Is doing ok overall  Just came off of workman's comp care with chronic back pain  Went off of his nucynta cold [place] -- and had a hard weekend - able to sleep last night  Still having a lot of pain in general  He will see a surgeon [facility] - ? Hopes he will not need surgical   No tremor or nausea or other symtoms Is having more pain  Just does not feel unsettled    bp is 146/98 today Wt is stable  Has not had his bp med for a few days  Mood-on paxil - that is working well for him  Hx of deg disc dz in LS with L4-5 partial discectomy- Dr Cranford Mon  (via [employer] comp) Has had no new injuries   Patient Active Problem List  Diagnosis  . DEPRESSION  . OBSTRUCTIVE SLEEP APNEA  . BACK PAIN  . HYPERTENSION, BENIGN  . BENIGN PROSTATIC HYPERTROPHY, WITH URINARY OBSTRUCTION  . Radiculopathy of arm   Past Medical History  Diagnosis Date  . Chronic back pain     workman's comp w/u and pain clinic in the past  . Acute prostatitis 02/2010  . Cancer     h/o prostate, father  . DDD (degenerative disc disease)     surgery   Past Surgical History  Procedure Laterality Date  . Esophagogastric fundoplication  2002  . Inguinal hernia repair  1996    mesh on second surgery  . Appendectomy  1998  . Incise and drain abcess      both knees  . Spine surgery      L4-5 partial discectomy with spur removal   History  Substance Use Topics  . Smoking status: Never Smoker   . Smokeless tobacco: Not on file  . Alcohol Use: No   Family History  Problem Relation Age of Onset  . Cancer Father     prostate   Allergies  Allergen Reactions  . Bupropion Hcl     REACTION: shaky   Current Outpatient Prescriptions on File Prior to Visit  Medication Sig Dispense Refill  . amLODipine (NORVASC) 10 MG tablet TAKE 1 TABLET (10 MG  TOTAL) BY MOUTH DAILY.  90 tablet  2  . Aspirin-Acetaminophen (GOODY BODY PAIN) 500-325 MG PACK OTC as directed       . PARoxetine (PAXIL) 20 MG tablet Take 1 tablet (20 mg total) by mouth daily.  90 tablet  0  . tapentadol (NUCYNTA) 50 MG TABS Take by mouth.       No current facility-administered medications on file prior to visit.        Review of Systems    Review of Systems  Constitutional: Negative for fever, appetite change, fatigue and unexpected weight change.  Eyes: Negative for pain and visual disturbance.  Respiratory: Negative for cough and shortness of breath.   Cardiovascular: Negative for cp or palpitations    Gastrointestinal: Negative for nausea, diarrhea and constipation.  Genitourinary: Negative for urgency and frequency.  Skin: Negative for pallor or rash   MSK pos for chronic back pain / neg for radiculopathy Neurological: Negative for weakness, light-headedness, numbness and headaches.  Hematological: Negative for adenopathy. Does not bruise/bleed easily.  Psychiatric/Behavioral: Negative for dysphoric mood. Pos for anxiety that is fairly controlled .  Objective:   Physical Exam  Constitutional: He appears well-developed and well-nourished. No distress.  HENT:  Head: Normocephalic and atraumatic.  Mouth/Throat: Oropharynx is clear and moist. No oropharyngeal exudate.  Eyes: Conjunctivae and EOM are normal. Pupils are equal, round, and reactive to light. Right eye exhibits no discharge. Left eye exhibits no discharge. No scleral icterus.  Neck: Normal range of motion. Neck supple. No JVD present. Carotid bruit is not present. No thyromegaly present.  Cardiovascular: Normal rate and regular rhythm.   Pulmonary/Chest: Effort normal and breath sounds normal.  Abdominal: He exhibits no abdominal bruit.  Musculoskeletal: He exhibits tenderness. He exhibits no edema.  Diffuse bony LS tenderness  Lymphadenopathy:    He has no cervical adenopathy.   Neurological: He is alert. He has normal reflexes. He displays tremor. No cranial nerve deficit. He exhibits normal muscle tone. Coordination normal.  Very slight hand tremor   Skin: Skin is warm and dry. No rash noted. No erythema. No pallor.  Psychiatric: He has a normal mood and affect.          Assessment & Plan:

## 2013-01-31 NOTE — Assessment & Plan Note (Signed)
bp up after missing several doses of amlodipine Will get back on that now  Disc lifestyle habits

## 2013-01-31 NOTE — Telephone Encounter (Signed)
K is still low -this could cause his muscle cramps Please call in Kcl 20 meq Re check K for hypokalemia in 2 wk please

## 2013-01-31 NOTE — Assessment & Plan Note (Signed)
Lab Results  Component Value Date   K 3.2* 08/25/2012   Never had this re checked and also has leg cramps occas  Is not taking mvi  Re check today suppl if necessary

## 2013-02-01 NOTE — Telephone Encounter (Signed)
Thanks

## 2013-02-01 NOTE — Telephone Encounter (Signed)
Left voicemail requesting pt to call office 

## 2013-02-01 NOTE — Telephone Encounter (Signed)
Pt left v/m requesting call back [phone].

## 2013-02-05 NOTE — Telephone Encounter (Signed)
Advised patient as instructed.  [facility] called [facility] [place].  Pt will call back to schedule follow up lab appt.

## 2013-05-10 ENCOUNTER — Telehealth: Payer: Self-pay

## 2013-05-10 NOTE — Telephone Encounter (Signed)
Pt wants CPX this week; pt said needs to have physical in re; to workers comp case. Advised pt could speak with Lyla Son or I could check to see if something could be scheduled and pt said thank you for checking but pt lives in [place] and works in [place] and he can get done in [place].

## 2013-05-28 ENCOUNTER — Other Ambulatory Visit: Payer: Self-pay | Admitting: Family Medicine

## 2013-05-30 NOTE — Telephone Encounter (Signed)
Electronic refill request, please advise  

## 2013-05-30 NOTE — Telephone Encounter (Signed)
Please refill for 12 mo, thanks

## 2013-05-30 NOTE — Telephone Encounter (Signed)
done

## 2013-07-27 ENCOUNTER — Other Ambulatory Visit: Payer: Self-pay | Admitting: *Deleted

## 2013-07-27 MED ORDER — AMLODIPINE BESYLATE 10 MG PO TABS: ORAL_TABLET | ORAL | Status: DC

## 2014-01-04 ENCOUNTER — Other Ambulatory Visit: Payer: Self-pay | Admitting: Family Medicine

## 2014-01-04 NOTE — Telephone Encounter (Signed)
Electronic refill request, please advise  

## 2014-01-04 NOTE — Telephone Encounter (Signed)
F/u scheduled for 01/23/14 and med refilled x1 month

## 2014-01-04 NOTE — Telephone Encounter (Signed)
Please schedule f/u for Feb or march and refill until then  Thanks

## 2014-01-23 ENCOUNTER — Ambulatory Visit: Payer: BC Managed Care – PPO | Admitting: Family Medicine

## 2014-01-23 DIAGNOSIS — Z0289 Encounter for other administrative examinations: Secondary | ICD-10-CM

## 2015-11-22 DIAGNOSIS — I10 Essential (primary) hypertension: Secondary | ICD-10-CM | POA: Diagnosis not present

## 2015-11-22 DIAGNOSIS — R9431 Abnormal electrocardiogram [ECG] [EKG]: Secondary | ICD-10-CM | POA: Diagnosis not present

## 2015-11-22 DIAGNOSIS — G8929 Other chronic pain: Secondary | ICD-10-CM | POA: Diagnosis present

## 2015-11-22 DIAGNOSIS — I2542 Coronary artery dissection: Secondary | ICD-10-CM | POA: Diagnosis present

## 2015-11-22 DIAGNOSIS — Z7982 Long term (current) use of aspirin: Secondary | ICD-10-CM | POA: Diagnosis not present

## 2015-11-22 DIAGNOSIS — R079 Chest pain, unspecified: Secondary | ICD-10-CM | POA: Diagnosis not present

## 2015-11-22 DIAGNOSIS — I071 Rheumatic tricuspid insufficiency: Secondary | ICD-10-CM | POA: Diagnosis present

## 2015-11-22 DIAGNOSIS — I214 Non-ST elevation (NSTEMI) myocardial infarction: Secondary | ICD-10-CM | POA: Diagnosis not present

## 2015-11-22 DIAGNOSIS — I2510 Atherosclerotic heart disease of native coronary artery without angina pectoris: Secondary | ICD-10-CM | POA: Diagnosis not present

## 2015-11-22 DIAGNOSIS — F329 Major depressive disorder, single episode, unspecified: Secondary | ICD-10-CM | POA: Diagnosis not present

## 2015-11-22 DIAGNOSIS — M549 Dorsalgia, unspecified: Secondary | ICD-10-CM | POA: Diagnosis not present

## 2015-11-22 DIAGNOSIS — K219 Gastro-esophageal reflux disease without esophagitis: Secondary | ICD-10-CM | POA: Diagnosis not present

## 2015-11-22 DIAGNOSIS — Z79899 Other long term (current) drug therapy: Secondary | ICD-10-CM | POA: Diagnosis not present

## 2015-12-04 DIAGNOSIS — I2510 Atherosclerotic heart disease of native coronary artery without angina pectoris: Secondary | ICD-10-CM | POA: Diagnosis not present

## 2015-12-04 DIAGNOSIS — R9431 Abnormal electrocardiogram [ECG] [EKG]: Secondary | ICD-10-CM | POA: Diagnosis not present

## 2015-12-04 DIAGNOSIS — Z6827 Body mass index (BMI) 27.0-27.9, adult: Secondary | ICD-10-CM | POA: Diagnosis not present

## 2015-12-26 DIAGNOSIS — I2510 Atherosclerotic heart disease of native coronary artery without angina pectoris: Secondary | ICD-10-CM | POA: Diagnosis not present

## 2015-12-26 DIAGNOSIS — R9431 Abnormal electrocardiogram [ECG] [EKG]: Secondary | ICD-10-CM | POA: Diagnosis not present

## 2016-01-22 DIAGNOSIS — M62838 Other muscle spasm: Secondary | ICD-10-CM | POA: Diagnosis not present

## 2016-01-22 DIAGNOSIS — M545 Low back pain: Secondary | ICD-10-CM | POA: Diagnosis not present

## 2016-01-22 DIAGNOSIS — Z6827 Body mass index (BMI) 27.0-27.9, adult: Secondary | ICD-10-CM | POA: Diagnosis not present

## 2016-04-15 DIAGNOSIS — M62838 Other muscle spasm: Secondary | ICD-10-CM | POA: Diagnosis not present

## 2016-04-15 DIAGNOSIS — M545 Low back pain: Secondary | ICD-10-CM | POA: Diagnosis not present

## 2016-07-15 DIAGNOSIS — M62838 Other muscle spasm: Secondary | ICD-10-CM | POA: Diagnosis not present

## 2016-07-15 DIAGNOSIS — M542 Cervicalgia: Secondary | ICD-10-CM | POA: Diagnosis not present

## 2016-07-15 DIAGNOSIS — M545 Low back pain: Secondary | ICD-10-CM | POA: Diagnosis not present

## 2016-07-15 DIAGNOSIS — M4120 Other idiopathic scoliosis, site unspecified: Secondary | ICD-10-CM | POA: Diagnosis not present

## 2016-10-08 DIAGNOSIS — M5416 Radiculopathy, lumbar region: Secondary | ICD-10-CM | POA: Diagnosis not present

## 2016-10-08 DIAGNOSIS — M545 Low back pain: Secondary | ICD-10-CM | POA: Diagnosis not present

## 2016-10-08 DIAGNOSIS — M4120 Other idiopathic scoliosis, site unspecified: Secondary | ICD-10-CM | POA: Diagnosis not present

## 2016-10-08 DIAGNOSIS — M542 Cervicalgia: Secondary | ICD-10-CM | POA: Diagnosis not present

## 2016-10-08 DIAGNOSIS — Z6827 Body mass index (BMI) 27.0-27.9, adult: Secondary | ICD-10-CM | POA: Diagnosis not present

## 2016-12-09 DIAGNOSIS — I252 Old myocardial infarction: Secondary | ICD-10-CM | POA: Diagnosis not present

## 2016-12-09 DIAGNOSIS — I10 Essential (primary) hypertension: Secondary | ICD-10-CM | POA: Diagnosis not present

## 2016-12-09 DIAGNOSIS — Z6829 Body mass index (BMI) 29.0-29.9, adult: Secondary | ICD-10-CM | POA: Diagnosis not present

## 2016-12-09 DIAGNOSIS — I2510 Atherosclerotic heart disease of native coronary artery without angina pectoris: Secondary | ICD-10-CM | POA: Diagnosis not present

## 2017-01-01 DIAGNOSIS — Z6827 Body mass index (BMI) 27.0-27.9, adult: Secondary | ICD-10-CM | POA: Diagnosis not present

## 2017-01-01 DIAGNOSIS — I10 Essential (primary) hypertension: Secondary | ICD-10-CM | POA: Diagnosis not present

## 2017-01-01 DIAGNOSIS — M545 Low back pain: Secondary | ICD-10-CM | POA: Diagnosis not present

## 2017-01-01 DIAGNOSIS — M542 Cervicalgia: Secondary | ICD-10-CM | POA: Diagnosis not present

## 2017-01-01 DIAGNOSIS — M5416 Radiculopathy, lumbar region: Secondary | ICD-10-CM | POA: Diagnosis not present

## 2017-03-13 DIAGNOSIS — I2510 Atherosclerotic heart disease of native coronary artery without angina pectoris: Secondary | ICD-10-CM | POA: Diagnosis not present

## 2017-03-13 DIAGNOSIS — M549 Dorsalgia, unspecified: Secondary | ICD-10-CM | POA: Diagnosis present

## 2017-03-13 DIAGNOSIS — E784 Other hyperlipidemia: Secondary | ICD-10-CM | POA: Diagnosis not present

## 2017-03-13 DIAGNOSIS — R0789 Other chest pain: Secondary | ICD-10-CM | POA: Diagnosis not present

## 2017-03-13 DIAGNOSIS — R079 Chest pain, unspecified: Secondary | ICD-10-CM | POA: Diagnosis not present

## 2017-03-13 DIAGNOSIS — I10 Essential (primary) hypertension: Secondary | ICD-10-CM | POA: Diagnosis present

## 2017-03-13 DIAGNOSIS — I119 Hypertensive heart disease without heart failure: Secondary | ICD-10-CM | POA: Diagnosis not present

## 2017-03-13 DIAGNOSIS — I208 Other forms of angina pectoris: Secondary | ICD-10-CM | POA: Diagnosis not present

## 2017-03-13 DIAGNOSIS — Z8679 Personal history of other diseases of the circulatory system: Secondary | ICD-10-CM | POA: Diagnosis not present

## 2017-03-13 DIAGNOSIS — Z6827 Body mass index (BMI) 27.0-27.9, adult: Secondary | ICD-10-CM | POA: Diagnosis not present

## 2017-03-13 DIAGNOSIS — Z79891 Long term (current) use of opiate analgesic: Secondary | ICD-10-CM | POA: Diagnosis not present

## 2017-03-13 DIAGNOSIS — I200 Unstable angina: Secondary | ICD-10-CM | POA: Diagnosis not present

## 2017-03-13 DIAGNOSIS — G8929 Other chronic pain: Secondary | ICD-10-CM | POA: Diagnosis not present

## 2017-03-13 DIAGNOSIS — I252 Old myocardial infarction: Secondary | ICD-10-CM | POA: Diagnosis not present

## 2017-03-13 DIAGNOSIS — E785 Hyperlipidemia, unspecified: Secondary | ICD-10-CM | POA: Diagnosis present

## 2017-03-13 DIAGNOSIS — I25110 Atherosclerotic heart disease of native coronary artery with unstable angina pectoris: Secondary | ICD-10-CM | POA: Diagnosis not present

## 2017-03-13 DIAGNOSIS — Z7982 Long term (current) use of aspirin: Secondary | ICD-10-CM | POA: Diagnosis not present

## 2017-03-13 DIAGNOSIS — R072 Precordial pain: Secondary | ICD-10-CM | POA: Diagnosis not present

## 2017-03-31 DIAGNOSIS — M4120 Other idiopathic scoliosis, site unspecified: Secondary | ICD-10-CM | POA: Diagnosis not present

## 2017-03-31 DIAGNOSIS — M545 Low back pain: Secondary | ICD-10-CM | POA: Diagnosis not present

## 2017-03-31 DIAGNOSIS — M5416 Radiculopathy, lumbar region: Secondary | ICD-10-CM | POA: Diagnosis not present

## 2017-03-31 DIAGNOSIS — Z6827 Body mass index (BMI) 27.0-27.9, adult: Secondary | ICD-10-CM | POA: Diagnosis not present

## 2017-03-31 DIAGNOSIS — M542 Cervicalgia: Secondary | ICD-10-CM | POA: Diagnosis not present

## 2017-03-31 DIAGNOSIS — I10 Essential (primary) hypertension: Secondary | ICD-10-CM | POA: Diagnosis not present

## 2017-04-01 DIAGNOSIS — Z6827 Body mass index (BMI) 27.0-27.9, adult: Secondary | ICD-10-CM | POA: Diagnosis not present

## 2017-04-01 DIAGNOSIS — R109 Unspecified abdominal pain: Secondary | ICD-10-CM | POA: Diagnosis not present

## 2017-04-01 DIAGNOSIS — I10 Essential (primary) hypertension: Secondary | ICD-10-CM | POA: Diagnosis not present

## 2017-04-01 DIAGNOSIS — Z1389 Encounter for screening for other disorder: Secondary | ICD-10-CM | POA: Diagnosis not present

## 2017-04-08 DIAGNOSIS — R109 Unspecified abdominal pain: Secondary | ICD-10-CM | POA: Diagnosis not present

## 2017-06-12 DIAGNOSIS — Z7689 Persons encountering health services in other specified circumstances: Secondary | ICD-10-CM | POA: Diagnosis not present

## 2017-06-12 DIAGNOSIS — H6693 Otitis media, unspecified, bilateral: Secondary | ICD-10-CM | POA: Diagnosis not present

## 2017-06-17 DIAGNOSIS — R079 Chest pain, unspecified: Secondary | ICD-10-CM | POA: Diagnosis not present

## 2017-06-17 DIAGNOSIS — Z7689 Persons encountering health services in other specified circumstances: Secondary | ICD-10-CM | POA: Diagnosis not present

## 2017-06-17 DIAGNOSIS — L989 Disorder of the skin and subcutaneous tissue, unspecified: Secondary | ICD-10-CM | POA: Diagnosis not present

## 2017-06-17 DIAGNOSIS — I2510 Atherosclerotic heart disease of native coronary artery without angina pectoris: Secondary | ICD-10-CM | POA: Diagnosis not present

## 2017-07-15 DIAGNOSIS — G8929 Other chronic pain: Secondary | ICD-10-CM | POA: Diagnosis not present

## 2017-07-15 DIAGNOSIS — R443 Hallucinations, unspecified: Secondary | ICD-10-CM | POA: Diagnosis not present

## 2017-07-15 DIAGNOSIS — Z79899 Other long term (current) drug therapy: Secondary | ICD-10-CM | POA: Diagnosis not present

## 2017-07-15 DIAGNOSIS — R112 Nausea with vomiting, unspecified: Secondary | ICD-10-CM | POA: Diagnosis not present

## 2017-07-15 DIAGNOSIS — I10 Essential (primary) hypertension: Secondary | ICD-10-CM | POA: Diagnosis not present

## 2017-07-15 DIAGNOSIS — G479 Sleep disorder, unspecified: Secondary | ICD-10-CM | POA: Diagnosis not present

## 2017-07-15 DIAGNOSIS — M549 Dorsalgia, unspecified: Secondary | ICD-10-CM | POA: Diagnosis not present

## 2017-07-15 DIAGNOSIS — I252 Old myocardial infarction: Secondary | ICD-10-CM | POA: Diagnosis not present

## 2017-07-15 DIAGNOSIS — F419 Anxiety disorder, unspecified: Secondary | ICD-10-CM | POA: Diagnosis not present

## 2017-07-15 DIAGNOSIS — R451 Restlessness and agitation: Secondary | ICD-10-CM | POA: Diagnosis not present

## 2017-07-15 DIAGNOSIS — F4010 Social phobia, unspecified: Secondary | ICD-10-CM | POA: Diagnosis not present

## 2017-07-20 DIAGNOSIS — M5416 Radiculopathy, lumbar region: Secondary | ICD-10-CM | POA: Diagnosis not present

## 2017-07-20 DIAGNOSIS — M545 Low back pain: Secondary | ICD-10-CM | POA: Diagnosis not present

## 2017-07-20 DIAGNOSIS — I10 Essential (primary) hypertension: Secondary | ICD-10-CM | POA: Diagnosis not present

## 2017-07-20 DIAGNOSIS — M4120 Other idiopathic scoliosis, site unspecified: Secondary | ICD-10-CM | POA: Diagnosis not present

## 2017-07-20 DIAGNOSIS — Z6827 Body mass index (BMI) 27.0-27.9, adult: Secondary | ICD-10-CM | POA: Diagnosis not present

## 2017-07-20 DIAGNOSIS — M542 Cervicalgia: Secondary | ICD-10-CM | POA: Diagnosis not present

## 2017-09-09 DIAGNOSIS — Z79899 Other long term (current) drug therapy: Secondary | ICD-10-CM | POA: Diagnosis not present

## 2017-09-09 DIAGNOSIS — G8929 Other chronic pain: Secondary | ICD-10-CM | POA: Diagnosis not present

## 2017-09-09 DIAGNOSIS — Z6827 Body mass index (BMI) 27.0-27.9, adult: Secondary | ICD-10-CM | POA: Diagnosis not present

## 2017-09-09 DIAGNOSIS — F418 Other specified anxiety disorders: Secondary | ICD-10-CM | POA: Diagnosis not present

## 2017-09-09 DIAGNOSIS — I10 Essential (primary) hypertension: Secondary | ICD-10-CM | POA: Diagnosis not present

## 2017-09-09 DIAGNOSIS — E559 Vitamin D deficiency, unspecified: Secondary | ICD-10-CM | POA: Diagnosis not present

## 2017-10-15 DIAGNOSIS — I10 Essential (primary) hypertension: Secondary | ICD-10-CM | POA: Diagnosis not present

## 2017-10-15 DIAGNOSIS — M5416 Radiculopathy, lumbar region: Secondary | ICD-10-CM | POA: Diagnosis not present

## 2017-10-15 DIAGNOSIS — Z6827 Body mass index (BMI) 27.0-27.9, adult: Secondary | ICD-10-CM | POA: Diagnosis not present

## 2017-11-09 DIAGNOSIS — E782 Mixed hyperlipidemia: Secondary | ICD-10-CM | POA: Diagnosis not present

## 2017-11-09 DIAGNOSIS — E559 Vitamin D deficiency, unspecified: Secondary | ICD-10-CM | POA: Diagnosis not present

## 2017-11-09 DIAGNOSIS — I10 Essential (primary) hypertension: Secondary | ICD-10-CM | POA: Diagnosis not present

## 2017-11-09 DIAGNOSIS — E781 Pure hyperglyceridemia: Secondary | ICD-10-CM | POA: Diagnosis not present

## 2018-01-12 DIAGNOSIS — M542 Cervicalgia: Secondary | ICD-10-CM | POA: Diagnosis not present

## 2018-01-12 DIAGNOSIS — M545 Low back pain: Secondary | ICD-10-CM | POA: Diagnosis not present

## 2018-01-12 DIAGNOSIS — I10 Essential (primary) hypertension: Secondary | ICD-10-CM | POA: Diagnosis not present

## 2018-01-12 DIAGNOSIS — M5416 Radiculopathy, lumbar region: Secondary | ICD-10-CM | POA: Diagnosis not present

## 2018-01-12 DIAGNOSIS — Z6827 Body mass index (BMI) 27.0-27.9, adult: Secondary | ICD-10-CM | POA: Diagnosis not present

## 2018-03-31 DIAGNOSIS — M545 Low back pain: Secondary | ICD-10-CM | POA: Diagnosis not present

## 2018-03-31 DIAGNOSIS — M5416 Radiculopathy, lumbar region: Secondary | ICD-10-CM | POA: Diagnosis not present

## 2018-03-31 DIAGNOSIS — Z6827 Body mass index (BMI) 27.0-27.9, adult: Secondary | ICD-10-CM | POA: Diagnosis not present

## 2018-03-31 DIAGNOSIS — I10 Essential (primary) hypertension: Secondary | ICD-10-CM | POA: Diagnosis not present

## 2018-03-31 DIAGNOSIS — M542 Cervicalgia: Secondary | ICD-10-CM | POA: Diagnosis not present

## 2018-04-05 DIAGNOSIS — M79645 Pain in left finger(s): Secondary | ICD-10-CM | POA: Diagnosis not present

## 2018-04-05 DIAGNOSIS — R109 Unspecified abdominal pain: Secondary | ICD-10-CM | POA: Diagnosis not present

## 2018-04-05 DIAGNOSIS — I10 Essential (primary) hypertension: Secondary | ICD-10-CM | POA: Diagnosis not present

## 2018-04-05 DIAGNOSIS — Z8639 Personal history of other endocrine, nutritional and metabolic disease: Secondary | ICD-10-CM | POA: Diagnosis not present

## 2018-04-05 DIAGNOSIS — M5441 Lumbago with sciatica, right side: Secondary | ICD-10-CM | POA: Diagnosis not present

## 2018-04-05 DIAGNOSIS — D171 Benign lipomatous neoplasm of skin and subcutaneous tissue of trunk: Secondary | ICD-10-CM | POA: Diagnosis not present

## 2018-04-05 DIAGNOSIS — G8929 Other chronic pain: Secondary | ICD-10-CM | POA: Diagnosis not present

## 2018-06-25 DIAGNOSIS — L720 Epidermal cyst: Secondary | ICD-10-CM | POA: Diagnosis not present

## 2018-06-25 DIAGNOSIS — D171 Benign lipomatous neoplasm of skin and subcutaneous tissue of trunk: Secondary | ICD-10-CM | POA: Diagnosis not present

## 2018-06-30 DIAGNOSIS — M542 Cervicalgia: Secondary | ICD-10-CM | POA: Diagnosis not present

## 2018-06-30 DIAGNOSIS — M545 Low back pain: Secondary | ICD-10-CM | POA: Diagnosis not present

## 2018-06-30 DIAGNOSIS — M5416 Radiculopathy, lumbar region: Secondary | ICD-10-CM | POA: Diagnosis not present

## 2018-06-30 DIAGNOSIS — M4120 Other idiopathic scoliosis, site unspecified: Secondary | ICD-10-CM | POA: Diagnosis not present

## 2018-07-01 DIAGNOSIS — D171 Benign lipomatous neoplasm of skin and subcutaneous tissue of trunk: Secondary | ICD-10-CM | POA: Diagnosis not present

## 2018-07-01 DIAGNOSIS — Z01818 Encounter for other preprocedural examination: Secondary | ICD-10-CM | POA: Diagnosis not present

## 2018-07-01 DIAGNOSIS — Z7689 Persons encountering health services in other specified circumstances: Secondary | ICD-10-CM | POA: Diagnosis not present

## 2018-07-07 DIAGNOSIS — M549 Dorsalgia, unspecified: Secondary | ICD-10-CM | POA: Diagnosis not present

## 2018-07-07 DIAGNOSIS — F329 Major depressive disorder, single episode, unspecified: Secondary | ICD-10-CM | POA: Diagnosis not present

## 2018-07-07 DIAGNOSIS — F419 Anxiety disorder, unspecified: Secondary | ICD-10-CM | POA: Diagnosis not present

## 2018-07-07 DIAGNOSIS — D171 Benign lipomatous neoplasm of skin and subcutaneous tissue of trunk: Secondary | ICD-10-CM | POA: Diagnosis not present

## 2018-07-07 DIAGNOSIS — K449 Diaphragmatic hernia without obstruction or gangrene: Secondary | ICD-10-CM | POA: Diagnosis not present

## 2018-07-07 DIAGNOSIS — M1990 Unspecified osteoarthritis, unspecified site: Secondary | ICD-10-CM | POA: Diagnosis not present

## 2018-07-07 DIAGNOSIS — L729 Follicular cyst of the skin and subcutaneous tissue, unspecified: Secondary | ICD-10-CM | POA: Diagnosis not present

## 2018-07-07 DIAGNOSIS — I10 Essential (primary) hypertension: Secondary | ICD-10-CM | POA: Diagnosis not present

## 2018-07-07 DIAGNOSIS — L720 Epidermal cyst: Secondary | ICD-10-CM | POA: Diagnosis not present

## 2018-08-31 DIAGNOSIS — M79642 Pain in left hand: Secondary | ICD-10-CM | POA: Diagnosis not present

## 2018-08-31 DIAGNOSIS — M79645 Pain in left finger(s): Secondary | ICD-10-CM | POA: Diagnosis not present

## 2018-08-31 DIAGNOSIS — M19042 Primary osteoarthritis, left hand: Secondary | ICD-10-CM | POA: Diagnosis not present

## 2018-08-31 DIAGNOSIS — M5441 Lumbago with sciatica, right side: Secondary | ICD-10-CM | POA: Diagnosis not present

## 2018-08-31 DIAGNOSIS — G8929 Other chronic pain: Secondary | ICD-10-CM | POA: Diagnosis not present

## 2018-09-21 DIAGNOSIS — M5416 Radiculopathy, lumbar region: Secondary | ICD-10-CM | POA: Diagnosis not present

## 2018-09-21 DIAGNOSIS — M545 Low back pain: Secondary | ICD-10-CM | POA: Diagnosis not present

## 2018-09-21 DIAGNOSIS — Z6827 Body mass index (BMI) 27.0-27.9, adult: Secondary | ICD-10-CM | POA: Diagnosis not present

## 2018-11-02 DIAGNOSIS — G8929 Other chronic pain: Secondary | ICD-10-CM | POA: Diagnosis not present

## 2018-11-02 DIAGNOSIS — G2581 Restless legs syndrome: Secondary | ICD-10-CM | POA: Diagnosis not present

## 2018-11-02 DIAGNOSIS — M545 Low back pain: Secondary | ICD-10-CM | POA: Diagnosis not present

## 2018-11-03 DIAGNOSIS — M545 Low back pain: Secondary | ICD-10-CM | POA: Diagnosis not present

## 2018-11-03 DIAGNOSIS — Z6827 Body mass index (BMI) 27.0-27.9, adult: Secondary | ICD-10-CM | POA: Diagnosis not present

## 2018-11-03 DIAGNOSIS — I10 Essential (primary) hypertension: Secondary | ICD-10-CM | POA: Diagnosis not present

## 2018-11-03 DIAGNOSIS — M5416 Radiculopathy, lumbar region: Secondary | ICD-10-CM | POA: Diagnosis not present

## 2019-02-23 DIAGNOSIS — M5136 Other intervertebral disc degeneration, lumbar region: Secondary | ICD-10-CM | POA: Diagnosis not present

## 2019-02-25 DIAGNOSIS — M5136 Other intervertebral disc degeneration, lumbar region: Secondary | ICD-10-CM | POA: Diagnosis not present

## 2019-03-01 DIAGNOSIS — M48061 Spinal stenosis, lumbar region without neurogenic claudication: Secondary | ICD-10-CM | POA: Diagnosis not present

## 2019-03-01 DIAGNOSIS — M5416 Radiculopathy, lumbar region: Secondary | ICD-10-CM | POA: Diagnosis not present

## 2019-03-01 DIAGNOSIS — M5136 Other intervertebral disc degeneration, lumbar region: Secondary | ICD-10-CM | POA: Diagnosis not present

## 2019-03-09 DIAGNOSIS — G8929 Other chronic pain: Secondary | ICD-10-CM | POA: Diagnosis not present

## 2019-03-09 DIAGNOSIS — M5440 Lumbago with sciatica, unspecified side: Secondary | ICD-10-CM | POA: Diagnosis not present

## 2019-03-09 DIAGNOSIS — K219 Gastro-esophageal reflux disease without esophagitis: Secondary | ICD-10-CM | POA: Diagnosis not present

## 2019-03-09 DIAGNOSIS — F331 Major depressive disorder, recurrent, moderate: Secondary | ICD-10-CM | POA: Diagnosis not present

## 2019-03-09 DIAGNOSIS — E118 Type 2 diabetes mellitus with unspecified complications: Secondary | ICD-10-CM | POA: Diagnosis not present

## 2019-03-09 DIAGNOSIS — G4730 Sleep apnea, unspecified: Secondary | ICD-10-CM | POA: Diagnosis not present

## 2019-03-09 DIAGNOSIS — R079 Chest pain, unspecified: Secondary | ICD-10-CM | POA: Diagnosis not present

## 2019-03-09 DIAGNOSIS — I10 Essential (primary) hypertension: Secondary | ICD-10-CM | POA: Diagnosis not present

## 2019-03-09 DIAGNOSIS — Z0000 Encounter for general adult medical examination without abnormal findings: Secondary | ICD-10-CM | POA: Diagnosis not present

## 2019-03-14 DIAGNOSIS — G8929 Other chronic pain: Secondary | ICD-10-CM | POA: Diagnosis not present

## 2019-03-14 DIAGNOSIS — Z79899 Other long term (current) drug therapy: Secondary | ICD-10-CM | POA: Diagnosis not present

## 2019-03-14 DIAGNOSIS — M79605 Pain in left leg: Secondary | ICD-10-CM | POA: Diagnosis not present

## 2019-03-14 DIAGNOSIS — M545 Low back pain: Secondary | ICD-10-CM | POA: Diagnosis not present

## 2019-03-30 DIAGNOSIS — M47816 Spondylosis without myelopathy or radiculopathy, lumbar region: Secondary | ICD-10-CM | POA: Diagnosis not present

## 2019-03-30 DIAGNOSIS — M5136 Other intervertebral disc degeneration, lumbar region: Secondary | ICD-10-CM | POA: Diagnosis not present

## 2019-03-30 DIAGNOSIS — M5416 Radiculopathy, lumbar region: Secondary | ICD-10-CM | POA: Diagnosis not present

## 2019-03-30 DIAGNOSIS — M7130 Other bursal cyst, unspecified site: Secondary | ICD-10-CM | POA: Diagnosis not present

## 2019-04-12 DIAGNOSIS — M545 Low back pain: Secondary | ICD-10-CM | POA: Diagnosis not present

## 2019-04-12 DIAGNOSIS — M79605 Pain in left leg: Secondary | ICD-10-CM | POA: Diagnosis not present

## 2019-04-12 DIAGNOSIS — Z79899 Other long term (current) drug therapy: Secondary | ICD-10-CM | POA: Diagnosis not present

## 2019-04-12 DIAGNOSIS — G8929 Other chronic pain: Secondary | ICD-10-CM | POA: Diagnosis not present

## 2019-04-14 DIAGNOSIS — M5416 Radiculopathy, lumbar region: Secondary | ICD-10-CM | POA: Diagnosis not present

## 2019-05-03 DIAGNOSIS — G479 Sleep disorder, unspecified: Secondary | ICD-10-CM | POA: Diagnosis not present

## 2019-05-10 DIAGNOSIS — M79605 Pain in left leg: Secondary | ICD-10-CM | POA: Diagnosis not present

## 2019-05-10 DIAGNOSIS — Z79899 Other long term (current) drug therapy: Secondary | ICD-10-CM | POA: Diagnosis not present

## 2019-05-10 DIAGNOSIS — M545 Low back pain: Secondary | ICD-10-CM | POA: Diagnosis not present

## 2019-05-10 DIAGNOSIS — G8929 Other chronic pain: Secondary | ICD-10-CM | POA: Diagnosis not present

## 2019-06-07 DIAGNOSIS — G8929 Other chronic pain: Secondary | ICD-10-CM | POA: Diagnosis not present

## 2019-06-07 DIAGNOSIS — M545 Low back pain: Secondary | ICD-10-CM | POA: Diagnosis not present

## 2019-06-07 DIAGNOSIS — Z79899 Other long term (current) drug therapy: Secondary | ICD-10-CM | POA: Diagnosis not present

## 2019-06-07 DIAGNOSIS — M79605 Pain in left leg: Secondary | ICD-10-CM | POA: Diagnosis not present

## 2019-07-06 DIAGNOSIS — M5440 Lumbago with sciatica, unspecified side: Secondary | ICD-10-CM | POA: Diagnosis not present

## 2019-07-06 DIAGNOSIS — M961 Postlaminectomy syndrome, not elsewhere classified: Secondary | ICD-10-CM | POA: Diagnosis not present

## 2019-07-06 DIAGNOSIS — G8929 Other chronic pain: Secondary | ICD-10-CM | POA: Diagnosis not present

## 2019-07-06 DIAGNOSIS — Z79899 Other long term (current) drug therapy: Secondary | ICD-10-CM | POA: Diagnosis not present

## 2019-07-06 DIAGNOSIS — M545 Low back pain: Secondary | ICD-10-CM | POA: Diagnosis not present

## 2019-07-19 DIAGNOSIS — M129 Arthropathy, unspecified: Secondary | ICD-10-CM | POA: Diagnosis not present

## 2019-07-19 DIAGNOSIS — G8929 Other chronic pain: Secondary | ICD-10-CM | POA: Diagnosis not present

## 2019-07-19 DIAGNOSIS — M545 Low back pain: Secondary | ICD-10-CM | POA: Diagnosis not present

## 2019-07-19 DIAGNOSIS — Z79899 Other long term (current) drug therapy: Secondary | ICD-10-CM | POA: Diagnosis not present

## 2019-07-19 DIAGNOSIS — M5440 Lumbago with sciatica, unspecified side: Secondary | ICD-10-CM | POA: Diagnosis not present

## 2019-07-19 DIAGNOSIS — M961 Postlaminectomy syndrome, not elsewhere classified: Secondary | ICD-10-CM | POA: Diagnosis not present

## 2019-07-19 DIAGNOSIS — Z1159 Encounter for screening for other viral diseases: Secondary | ICD-10-CM | POA: Diagnosis not present

## 2019-08-02 DIAGNOSIS — M545 Low back pain: Secondary | ICD-10-CM | POA: Diagnosis not present

## 2019-08-02 DIAGNOSIS — G8929 Other chronic pain: Secondary | ICD-10-CM | POA: Diagnosis not present

## 2019-08-02 DIAGNOSIS — M5440 Lumbago with sciatica, unspecified side: Secondary | ICD-10-CM | POA: Diagnosis not present

## 2019-08-02 DIAGNOSIS — M961 Postlaminectomy syndrome, not elsewhere classified: Secondary | ICD-10-CM | POA: Diagnosis not present

## 2019-08-02 DIAGNOSIS — Z79899 Other long term (current) drug therapy: Secondary | ICD-10-CM | POA: Diagnosis not present

## 2019-09-01 DIAGNOSIS — M5440 Lumbago with sciatica, unspecified side: Secondary | ICD-10-CM | POA: Diagnosis not present

## 2019-09-01 DIAGNOSIS — Z79899 Other long term (current) drug therapy: Secondary | ICD-10-CM | POA: Diagnosis not present

## 2019-09-01 DIAGNOSIS — M961 Postlaminectomy syndrome, not elsewhere classified: Secondary | ICD-10-CM | POA: Diagnosis not present

## 2019-09-01 DIAGNOSIS — G8929 Other chronic pain: Secondary | ICD-10-CM | POA: Diagnosis not present

## 2019-09-01 DIAGNOSIS — M545 Low back pain: Secondary | ICD-10-CM | POA: Diagnosis not present

## 2019-09-29 DIAGNOSIS — M545 Low back pain: Secondary | ICD-10-CM | POA: Diagnosis not present

## 2019-09-29 DIAGNOSIS — Z79899 Other long term (current) drug therapy: Secondary | ICD-10-CM | POA: Diagnosis not present

## 2019-09-29 DIAGNOSIS — M961 Postlaminectomy syndrome, not elsewhere classified: Secondary | ICD-10-CM | POA: Diagnosis not present

## 2019-09-29 DIAGNOSIS — G8929 Other chronic pain: Secondary | ICD-10-CM | POA: Diagnosis not present

## 2019-09-29 DIAGNOSIS — M5440 Lumbago with sciatica, unspecified side: Secondary | ICD-10-CM | POA: Diagnosis not present

## 2019-10-28 DIAGNOSIS — M545 Low back pain: Secondary | ICD-10-CM | POA: Diagnosis not present

## 2019-10-28 DIAGNOSIS — G8929 Other chronic pain: Secondary | ICD-10-CM | POA: Diagnosis not present

## 2019-10-28 DIAGNOSIS — M961 Postlaminectomy syndrome, not elsewhere classified: Secondary | ICD-10-CM | POA: Diagnosis not present

## 2019-10-28 DIAGNOSIS — M5440 Lumbago with sciatica, unspecified side: Secondary | ICD-10-CM | POA: Diagnosis not present

## 2019-10-28 DIAGNOSIS — Z79899 Other long term (current) drug therapy: Secondary | ICD-10-CM | POA: Diagnosis not present

## 2019-11-22 DIAGNOSIS — Z03818 Encounter for observation for suspected exposure to other biological agents ruled out: Secondary | ICD-10-CM | POA: Diagnosis not present

## 2019-11-24 DIAGNOSIS — Z20828 Contact with and (suspected) exposure to other viral communicable diseases: Secondary | ICD-10-CM | POA: Diagnosis not present

## 2020-01-06 DIAGNOSIS — Z20822 Contact with and (suspected) exposure to covid-19: Secondary | ICD-10-CM | POA: Diagnosis not present

## 2020-01-06 DIAGNOSIS — I10 Essential (primary) hypertension: Secondary | ICD-10-CM | POA: Diagnosis not present

## 2020-01-06 DIAGNOSIS — E118 Type 2 diabetes mellitus with unspecified complications: Secondary | ICD-10-CM | POA: Diagnosis not present

## 2020-01-18 DIAGNOSIS — M545 Low back pain: Secondary | ICD-10-CM | POA: Diagnosis not present

## 2020-01-18 DIAGNOSIS — M961 Postlaminectomy syndrome, not elsewhere classified: Secondary | ICD-10-CM | POA: Diagnosis not present

## 2020-01-18 DIAGNOSIS — Z79899 Other long term (current) drug therapy: Secondary | ICD-10-CM | POA: Diagnosis not present

## 2020-01-18 DIAGNOSIS — G8929 Other chronic pain: Secondary | ICD-10-CM | POA: Diagnosis not present

## 2020-01-18 DIAGNOSIS — M5440 Lumbago with sciatica, unspecified side: Secondary | ICD-10-CM | POA: Diagnosis not present

## 2020-02-16 DIAGNOSIS — J029 Acute pharyngitis, unspecified: Secondary | ICD-10-CM | POA: Diagnosis not present

## 2020-02-16 DIAGNOSIS — R197 Diarrhea, unspecified: Secondary | ICD-10-CM | POA: Diagnosis not present

## 2020-02-16 DIAGNOSIS — R509 Fever, unspecified: Secondary | ICD-10-CM | POA: Diagnosis not present

## 2020-02-16 DIAGNOSIS — Z20822 Contact with and (suspected) exposure to covid-19: Secondary | ICD-10-CM | POA: Diagnosis not present

## 2020-02-21 DIAGNOSIS — G8929 Other chronic pain: Secondary | ICD-10-CM | POA: Diagnosis not present

## 2020-02-21 DIAGNOSIS — M961 Postlaminectomy syndrome, not elsewhere classified: Secondary | ICD-10-CM | POA: Diagnosis not present

## 2020-02-21 DIAGNOSIS — M5440 Lumbago with sciatica, unspecified side: Secondary | ICD-10-CM | POA: Diagnosis not present

## 2020-02-21 DIAGNOSIS — Z79899 Other long term (current) drug therapy: Secondary | ICD-10-CM | POA: Diagnosis not present

## 2020-02-21 DIAGNOSIS — Z1159 Encounter for screening for other viral diseases: Secondary | ICD-10-CM | POA: Diagnosis not present

## 2020-02-21 DIAGNOSIS — M79605 Pain in left leg: Secondary | ICD-10-CM | POA: Diagnosis not present

## 2020-03-02 DIAGNOSIS — R319 Hematuria, unspecified: Secondary | ICD-10-CM | POA: Diagnosis not present

## 2020-03-02 DIAGNOSIS — R300 Dysuria: Secondary | ICD-10-CM | POA: Diagnosis not present

## 2020-03-02 DIAGNOSIS — N419 Inflammatory disease of prostate, unspecified: Secondary | ICD-10-CM | POA: Diagnosis not present

## 2020-03-15 DIAGNOSIS — Z79899 Other long term (current) drug therapy: Secondary | ICD-10-CM | POA: Diagnosis not present

## 2020-03-15 DIAGNOSIS — M5440 Lumbago with sciatica, unspecified side: Secondary | ICD-10-CM | POA: Diagnosis not present

## 2020-03-15 DIAGNOSIS — M79605 Pain in left leg: Secondary | ICD-10-CM | POA: Diagnosis not present

## 2020-03-15 DIAGNOSIS — M961 Postlaminectomy syndrome, not elsewhere classified: Secondary | ICD-10-CM | POA: Diagnosis not present

## 2020-03-15 DIAGNOSIS — G8929 Other chronic pain: Secondary | ICD-10-CM | POA: Diagnosis not present

## 2020-03-30 DIAGNOSIS — Z23 Encounter for immunization: Secondary | ICD-10-CM | POA: Diagnosis not present

## 2020-04-09 DIAGNOSIS — R7301 Impaired fasting glucose: Secondary | ICD-10-CM | POA: Diagnosis not present

## 2020-04-09 DIAGNOSIS — I10 Essential (primary) hypertension: Secondary | ICD-10-CM | POA: Diagnosis not present

## 2020-04-09 DIAGNOSIS — R079 Chest pain, unspecified: Secondary | ICD-10-CM | POA: Diagnosis not present

## 2020-04-16 DIAGNOSIS — G8929 Other chronic pain: Secondary | ICD-10-CM | POA: Diagnosis not present

## 2020-04-16 DIAGNOSIS — M5440 Lumbago with sciatica, unspecified side: Secondary | ICD-10-CM | POA: Diagnosis not present

## 2020-04-16 DIAGNOSIS — M961 Postlaminectomy syndrome, not elsewhere classified: Secondary | ICD-10-CM | POA: Diagnosis not present

## 2020-04-16 DIAGNOSIS — M79605 Pain in left leg: Secondary | ICD-10-CM | POA: Diagnosis not present

## 2020-04-16 DIAGNOSIS — Z79899 Other long term (current) drug therapy: Secondary | ICD-10-CM | POA: Diagnosis not present

## 2020-04-27 DIAGNOSIS — Z23 Encounter for immunization: Secondary | ICD-10-CM | POA: Diagnosis not present

## 2020-05-15 DIAGNOSIS — M5440 Lumbago with sciatica, unspecified side: Secondary | ICD-10-CM | POA: Diagnosis not present

## 2020-05-15 DIAGNOSIS — M961 Postlaminectomy syndrome, not elsewhere classified: Secondary | ICD-10-CM | POA: Diagnosis not present

## 2020-05-15 DIAGNOSIS — M79605 Pain in left leg: Secondary | ICD-10-CM | POA: Diagnosis not present

## 2020-05-15 DIAGNOSIS — G8929 Other chronic pain: Secondary | ICD-10-CM | POA: Diagnosis not present

## 2020-05-15 DIAGNOSIS — Z79899 Other long term (current) drug therapy: Secondary | ICD-10-CM | POA: Diagnosis not present

## 2020-07-16 DIAGNOSIS — R5383 Other fatigue: Secondary | ICD-10-CM | POA: Diagnosis not present

## 2020-07-16 DIAGNOSIS — R079 Chest pain, unspecified: Secondary | ICD-10-CM | POA: Diagnosis not present

## 2020-07-16 DIAGNOSIS — R39198 Other difficulties with micturition: Secondary | ICD-10-CM | POA: Diagnosis not present

## 2020-08-20 DIAGNOSIS — M7041 Prepatellar bursitis, right knee: Secondary | ICD-10-CM | POA: Diagnosis not present

## 2020-08-20 DIAGNOSIS — M25561 Pain in right knee: Secondary | ICD-10-CM | POA: Diagnosis not present

## 2020-08-20 DIAGNOSIS — I70201 Unspecified atherosclerosis of native arteries of extremities, right leg: Secondary | ICD-10-CM | POA: Diagnosis not present

## 2020-08-20 DIAGNOSIS — M7989 Other specified soft tissue disorders: Secondary | ICD-10-CM | POA: Diagnosis not present

## 2020-10-10 DIAGNOSIS — R079 Chest pain, unspecified: Secondary | ICD-10-CM | POA: Diagnosis not present

## 2020-10-10 DIAGNOSIS — R0902 Hypoxemia: Secondary | ICD-10-CM | POA: Diagnosis not present

## 2020-10-10 DIAGNOSIS — K219 Gastro-esophageal reflux disease without esophagitis: Secondary | ICD-10-CM | POA: Diagnosis present

## 2020-10-10 DIAGNOSIS — I252 Old myocardial infarction: Secondary | ICD-10-CM | POA: Diagnosis not present

## 2020-10-10 DIAGNOSIS — J9601 Acute respiratory failure with hypoxia: Secondary | ICD-10-CM | POA: Diagnosis not present

## 2020-10-10 DIAGNOSIS — R918 Other nonspecific abnormal finding of lung field: Secondary | ICD-10-CM | POA: Diagnosis not present

## 2020-10-10 DIAGNOSIS — R0602 Shortness of breath: Secondary | ICD-10-CM | POA: Diagnosis not present

## 2020-10-10 DIAGNOSIS — I10 Essential (primary) hypertension: Secondary | ICD-10-CM | POA: Diagnosis present

## 2020-10-10 DIAGNOSIS — J159 Unspecified bacterial pneumonia: Secondary | ICD-10-CM | POA: Diagnosis present

## 2020-10-10 DIAGNOSIS — Z79899 Other long term (current) drug therapy: Secondary | ICD-10-CM | POA: Diagnosis not present

## 2020-10-10 DIAGNOSIS — F32A Depression, unspecified: Secondary | ICD-10-CM | POA: Diagnosis not present

## 2020-10-10 DIAGNOSIS — Z23 Encounter for immunization: Secondary | ICD-10-CM | POA: Diagnosis not present

## 2020-10-10 DIAGNOSIS — R071 Chest pain on breathing: Secondary | ICD-10-CM | POA: Diagnosis not present

## 2020-10-10 DIAGNOSIS — G8929 Other chronic pain: Secondary | ICD-10-CM | POA: Diagnosis not present

## 2020-10-10 DIAGNOSIS — Z20822 Contact with and (suspected) exposure to covid-19: Secondary | ICD-10-CM | POA: Diagnosis not present

## 2020-10-10 DIAGNOSIS — J189 Pneumonia, unspecified organism: Secondary | ICD-10-CM | POA: Diagnosis not present

## 2020-10-12 DIAGNOSIS — R079 Chest pain, unspecified: Secondary | ICD-10-CM | POA: Diagnosis not present

## 2020-10-19 DIAGNOSIS — J1282 Pneumonia due to coronavirus disease 2019: Secondary | ICD-10-CM | POA: Diagnosis not present

## 2020-10-19 DIAGNOSIS — Z09 Encounter for follow-up examination after completed treatment for conditions other than malignant neoplasm: Secondary | ICD-10-CM | POA: Diagnosis not present

## 2020-10-19 DIAGNOSIS — U071 COVID-19: Secondary | ICD-10-CM | POA: Diagnosis not present

## 2020-10-19 DIAGNOSIS — R062 Wheezing: Secondary | ICD-10-CM | POA: Diagnosis not present

## 2020-10-29 DIAGNOSIS — R059 Cough, unspecified: Secondary | ICD-10-CM | POA: Diagnosis not present

## 2020-10-29 DIAGNOSIS — R0602 Shortness of breath: Secondary | ICD-10-CM | POA: Diagnosis not present
